# Patient Record
Sex: Male | Born: 1955 | Race: White | Hispanic: No | Marital: Married | Smoking: Never smoker
Health system: Southern US, Community
[De-identification: ages and names within clinical notes are randomized; demographics above are authoritative.]

## PROBLEM LIST (undated history)

## (undated) DIAGNOSIS — L570 Actinic keratosis: Secondary | ICD-10-CM

## (undated) DIAGNOSIS — F419 Anxiety disorder, unspecified: Secondary | ICD-10-CM

## (undated) DIAGNOSIS — M1990 Unspecified osteoarthritis, unspecified site: Secondary | ICD-10-CM

## (undated) DIAGNOSIS — K4090 Unilateral inguinal hernia, without obstruction or gangrene, not specified as recurrent: Secondary | ICD-10-CM

## (undated) DIAGNOSIS — M76891 Other specified enthesopathies of right lower limb, excluding foot: Secondary | ICD-10-CM

## (undated) DIAGNOSIS — I499 Cardiac arrhythmia, unspecified: Secondary | ICD-10-CM

## (undated) DIAGNOSIS — M722 Plantar fascial fibromatosis: Secondary | ICD-10-CM

## (undated) DIAGNOSIS — E7800 Pure hypercholesterolemia, unspecified: Secondary | ICD-10-CM

## (undated) HISTORY — PX: COLONOSCOPY: SHX174

## (undated) HISTORY — PX: TONSILLECTOMY: SUR1361

## (undated) HISTORY — PX: NASAL SEPTUM SURGERY: SHX37

---

## 2001-02-12 ENCOUNTER — Ambulatory Visit (HOSPITAL_COMMUNITY): Admission: RE | Admit: 2001-02-12 | Discharge: 2001-02-12 | Payer: Self-pay | Admitting: Gastroenterology

## 2003-11-17 ENCOUNTER — Other Ambulatory Visit: Payer: Self-pay

## 2013-09-20 ENCOUNTER — Other Ambulatory Visit: Payer: Self-pay | Admitting: *Deleted

## 2013-09-20 ENCOUNTER — Encounter: Payer: Self-pay | Admitting: Podiatry

## 2013-09-20 MED ORDER — MELOXICAM 15 MG PO TABS: 15.0000 mg | ORAL_TABLET | Freq: Every day | ORAL | Status: DC

## 2013-09-20 NOTE — Telephone Encounter (Signed)
FAX REQUEST FROM [facility] REQ MELOXICAM 15 MG #30 WITH 3 REFILLS APPROVED BY DR REGAL.

## 2014-04-14 ENCOUNTER — Other Ambulatory Visit: Payer: Self-pay | Admitting: *Deleted

## 2014-04-14 MED ORDER — MELOXICAM 15 MG PO TABS: 15.0000 mg | ORAL_TABLET | Freq: Every day | ORAL | Status: DC

## 2014-04-14 NOTE — Telephone Encounter (Signed)
REFILL FOR MOBIC OK

## 2014-12-20 ENCOUNTER — Telehealth: Payer: Self-pay | Admitting: Gastroenterology

## 2014-12-20 NOTE — Telephone Encounter (Signed)
Colonoscopy triage °

## 2015-01-11 ENCOUNTER — Telehealth: Payer: Self-pay | Admitting: Gastroenterology

## 2015-01-11 NOTE — Telephone Encounter (Signed)
Phoned patient for colon triage, no answer left message to contact office  °

## 2015-01-11 NOTE — Telephone Encounter (Signed)
Phoned patient for colon triage, no answer left VM to contact office

## 2015-06-06 ENCOUNTER — Encounter: Payer: Self-pay | Admitting: Podiatry

## 2015-06-06 ENCOUNTER — Ambulatory Visit (INDEPENDENT_AMBULATORY_CARE_PROVIDER_SITE_OTHER): Payer: BLUE CROSS/BLUE SHIELD

## 2015-06-06 ENCOUNTER — Ambulatory Visit (INDEPENDENT_AMBULATORY_CARE_PROVIDER_SITE_OTHER): Payer: BLUE CROSS/BLUE SHIELD | Admitting: Podiatry

## 2015-06-06 VITALS — BP 145/88 | HR 50 | Resp 16 | Ht 72.0 in | Wt 180.0 lb

## 2015-06-06 DIAGNOSIS — M722 Plantar fascial fibromatosis: Secondary | ICD-10-CM

## 2015-06-06 MED ORDER — TRIAMCINOLONE ACETONIDE 10 MG/ML IJ SUSP
10.0000 mg | Freq: Once | INTRAMUSCULAR | Status: AC
  Administered 2015-06-06: 10 mg

## 2015-06-06 NOTE — Progress Notes (Signed)
   Subjective:    Patient ID: Alex Burch, male    DOB: 04-13-1956, 60 y.o.   MRN: [id number]  HPI Patient presents with bilateral foot pain. On Left foot-heel; Right foot-arch. Pt stated, "arch feels tender"; x2 months.  Pt had EPF surgery on right foot 7-8 yrs ago.   Review of Systems  Respiratory: Positive for cough.   All other systems reviewed and are negative.      Objective:   Physical Exam        Assessment & Plan:

## 2015-06-06 NOTE — Patient Instructions (Signed)

## 2015-06-08 NOTE — Progress Notes (Signed)
Subjective:     Patient ID: Alex Burch, male   DOB: 11/11/1955, 60 y.o.   MRN: [id number]  HPI patient states my left heel has been bothering me some and while I'm very active I'm starting to notice pain over the last several months. The right arch is slightly sore but the heel that surgery was done on is doing very well   Review of Systems  All other systems reviewed and are negative.      Objective:   Physical Exam  Constitutional: He is oriented to person, place, and time.  Cardiovascular: Intact distal pulses.   Musculoskeletal: Normal range of motion.  Neurological: He is oriented to person, place, and time.  Skin: Skin is warm.  Nursing note and vitals reviewed.  neurovascular status found to be intact with muscle strength adequate range of motion within normal limits. Moderate depression of the arch is noted bilateral with a well-healed surgical scar on the right heel secondary to endoscopic surgery. The left heel is quite tender when pressed and there is fluid buildup     Assessment:      acute plantar fasciitis left with long-term history of condition and moderate structural deformity    Plan:      H&P and x-rays of both feet reviewed with patient. Today I injected the medial band left 3 mg Kenalog 5 mg Xylocaine and gave instructions on physical therapy and dispensed fascial brace. If symptoms persist may require new orthotics and we will reevaluate as needed

## 2015-12-05 ENCOUNTER — Ambulatory Visit (INDEPENDENT_AMBULATORY_CARE_PROVIDER_SITE_OTHER): Payer: BLUE CROSS/BLUE SHIELD | Admitting: Podiatry

## 2015-12-05 ENCOUNTER — Encounter: Payer: Self-pay | Admitting: Podiatry

## 2015-12-05 DIAGNOSIS — M722 Plantar fascial fibromatosis: Secondary | ICD-10-CM

## 2015-12-05 MED ORDER — TRIAMCINOLONE ACETONIDE 10 MG/ML IJ SUSP
10.0000 mg | Freq: Once | INTRAMUSCULAR | Status: AC
  Administered 2015-12-05: 10 mg

## 2015-12-05 NOTE — Progress Notes (Signed)
Subjective:     Patient ID: Alex Burch, male   DOB: June 20, 1955, 60 y.o.   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 Reappoint when ready

## 2016-02-01 ENCOUNTER — Ambulatory Visit (INDEPENDENT_AMBULATORY_CARE_PROVIDER_SITE_OTHER): Payer: BLUE CROSS/BLUE SHIELD | Admitting: Podiatry

## 2016-02-01 ENCOUNTER — Encounter: Payer: Self-pay | Admitting: Podiatry

## 2016-02-01 DIAGNOSIS — M722 Plantar fascial fibromatosis: Secondary | ICD-10-CM | POA: Diagnosis not present

## 2016-02-01 MED ORDER — TRIAMCINOLONE ACETONIDE 10 MG/ML IJ SUSP
10.0000 mg | Freq: Once | INTRAMUSCULAR | Status: AC
  Administered 2016-02-01: 10 mg

## 2016-02-03 NOTE — Progress Notes (Signed)
Subjective:     Patient ID: Alex Burch, male   DOB: 1955-10-07, 60 y.o.   MRN: [id number]  HPI patient states I'm doing well but I still get pain in this left arch   Review of Systems     Objective:   Physical Exam Neurovascular status intact muscle strength adequate discomfort in the mid arch area left with orthotics that appear to be healing and fitted well    Assessment:     Residual plantar fasciitis left arch    Plan:     Careful mid arch injection left administered 3 mg Kenalog 5 mg Xylocaine and advised on supportive shoes physical therapy and reappoint to recheck

## 2016-10-13 ENCOUNTER — Ambulatory Visit (INDEPENDENT_AMBULATORY_CARE_PROVIDER_SITE_OTHER): Payer: BLUE CROSS/BLUE SHIELD | Admitting: Podiatry

## 2016-10-13 DIAGNOSIS — T148XXA Other injury of unspecified body region, initial encounter: Secondary | ICD-10-CM

## 2016-10-13 DIAGNOSIS — W450XXA Nail entering through skin, initial encounter: Secondary | ICD-10-CM

## 2016-10-13 NOTE — Progress Notes (Signed)
This patient presents the office with chief complaint that his left big toe is developing fluid under the cuticle. He says this been present for the last 3-4 days. He says he is very active and athletic. He states that it has gotten worse, but it is not infected or inflamed or draining. He presents the office for an evaluation and possible treatment at this time  GENERAL APPEARANCE: Alert, conversant. Appropriately groomed. No acute distress.  VASCULAR: Pedal pulses are  palpable at  [facility] and PT bilateral.  Capillary refill time is immediate to all digits,  Normal temperature gradient.  Digital hair growth is present bilateral  NEUROLOGIC: sensation is normal to 5.07 monofilament at 5/5 sites bilateral.  Light touch is intact bilateral, Muscle strength normal.  MUSCULOSKELETAL: acceptable muscle strength, tone and stability bilateral.  Intrinsic muscluature intact bilateral.  Rectus appearance of foot and digits noted bilateral.   DERMATOLOGIC: skin color, texture, and turgor are within normal limits.  No preulcerative lesions or ulcers  are seen, no interdigital maceration noted.  No open lesions present.  . No drainage noted.    Medial border right great toe is surgically removed.  The proximal nail fold medial left is elevated under the proximal nail fold.  No redness or swelling noted.  Hematoma secondary to movement to the medial left proximal nail fold.  Treatment.  ROV.  Discussed this condition with this patient and explained that the nail has become unattached at the proximal nail fold which has led to redness and inflammation at this point, I chose to allow the toe to declare itself. I explained to the patient. It may improve on its own or it worsen, at which time we need to consider surgical correction of that nail border. Return to clinic when necessary   Gardiner Barefoot DPM

## 2016-11-11 ENCOUNTER — Ambulatory Visit
Admission: RE | Admit: 2016-11-11 | Discharge: 2016-11-11 | Disposition: A | Discharge status: Home / Self Care | Payer: BLUE CROSS/BLUE SHIELD | Source: Ambulatory Visit | Attending: Internal Medicine | Admitting: Internal Medicine

## 2016-11-11 ENCOUNTER — Other Ambulatory Visit: Payer: Self-pay | Admitting: Internal Medicine

## 2016-11-11 DIAGNOSIS — R918 Other nonspecific abnormal finding of lung field: Secondary | ICD-10-CM | POA: Diagnosis not present

## 2016-11-11 DIAGNOSIS — R059 Cough, unspecified: Secondary | ICD-10-CM

## 2016-11-11 DIAGNOSIS — R05 Cough: Secondary | ICD-10-CM

## 2017-12-15 IMAGING — CR DG CHEST 2V
1 series · 3 of 3 positions shown · non-contrast
Comparison: None in PACs

CLINICAL DATA: Two weeks of cough and wheezing and fatigue. Not
improving.

EXAM:
CHEST  2 VIEW

[Series 1: dg chest 2 view · 0.14mm/px · 3 of 3 slices shown]
[im 1/3]
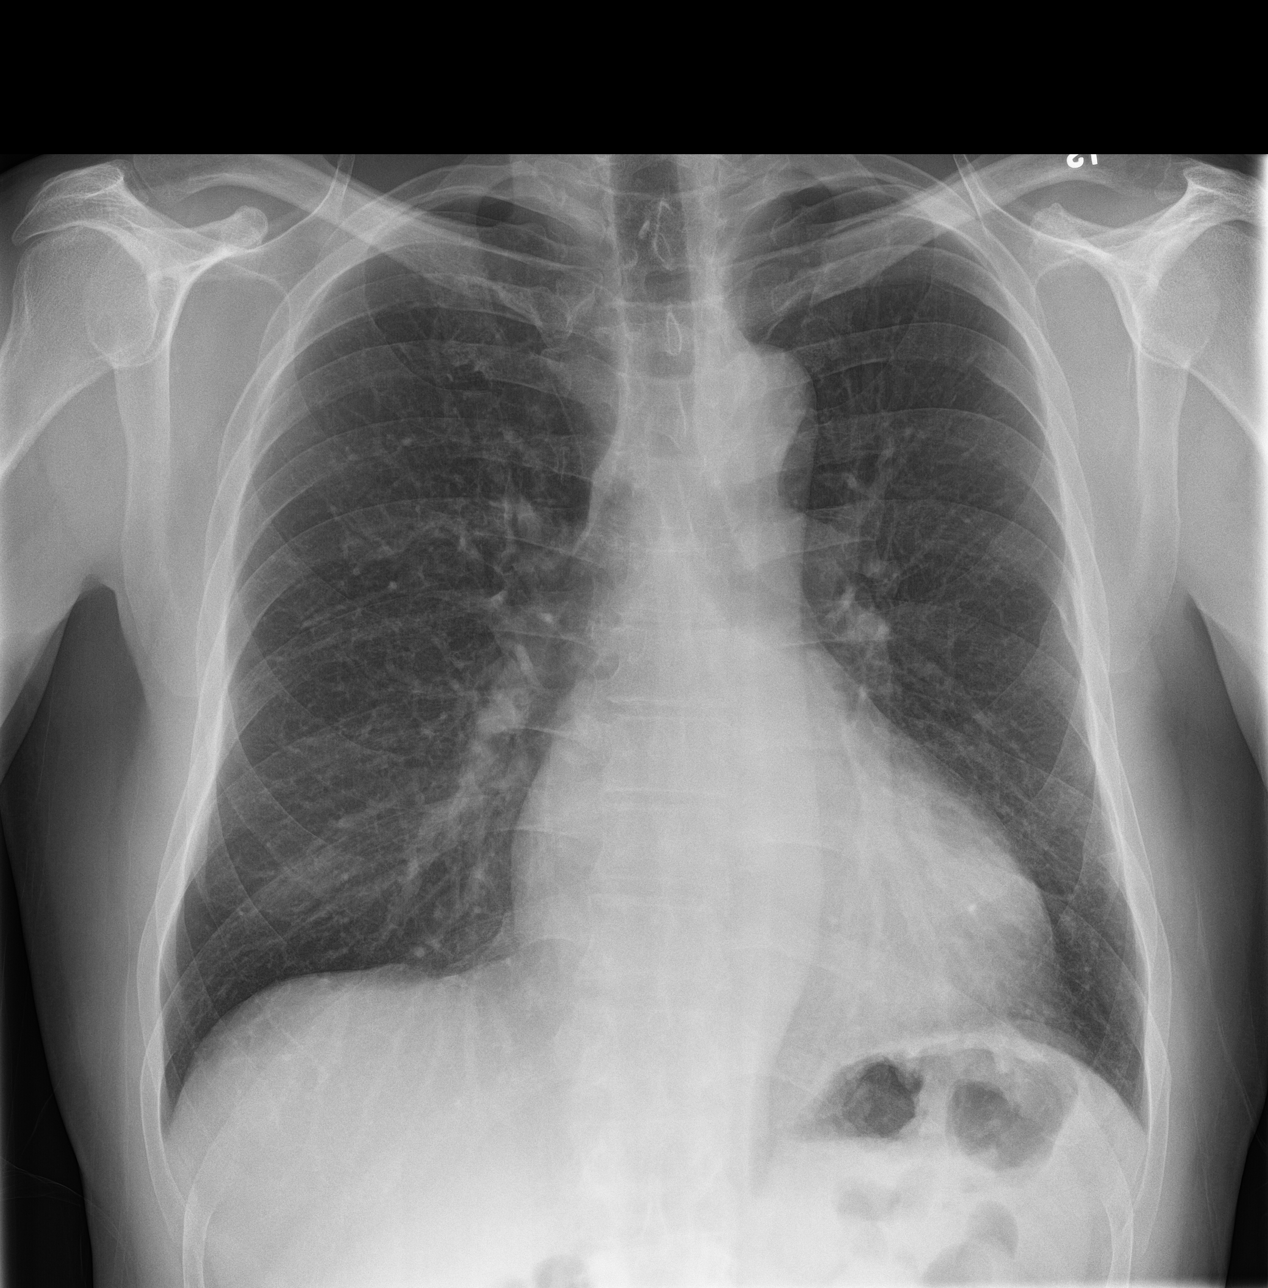
[im 2/3]
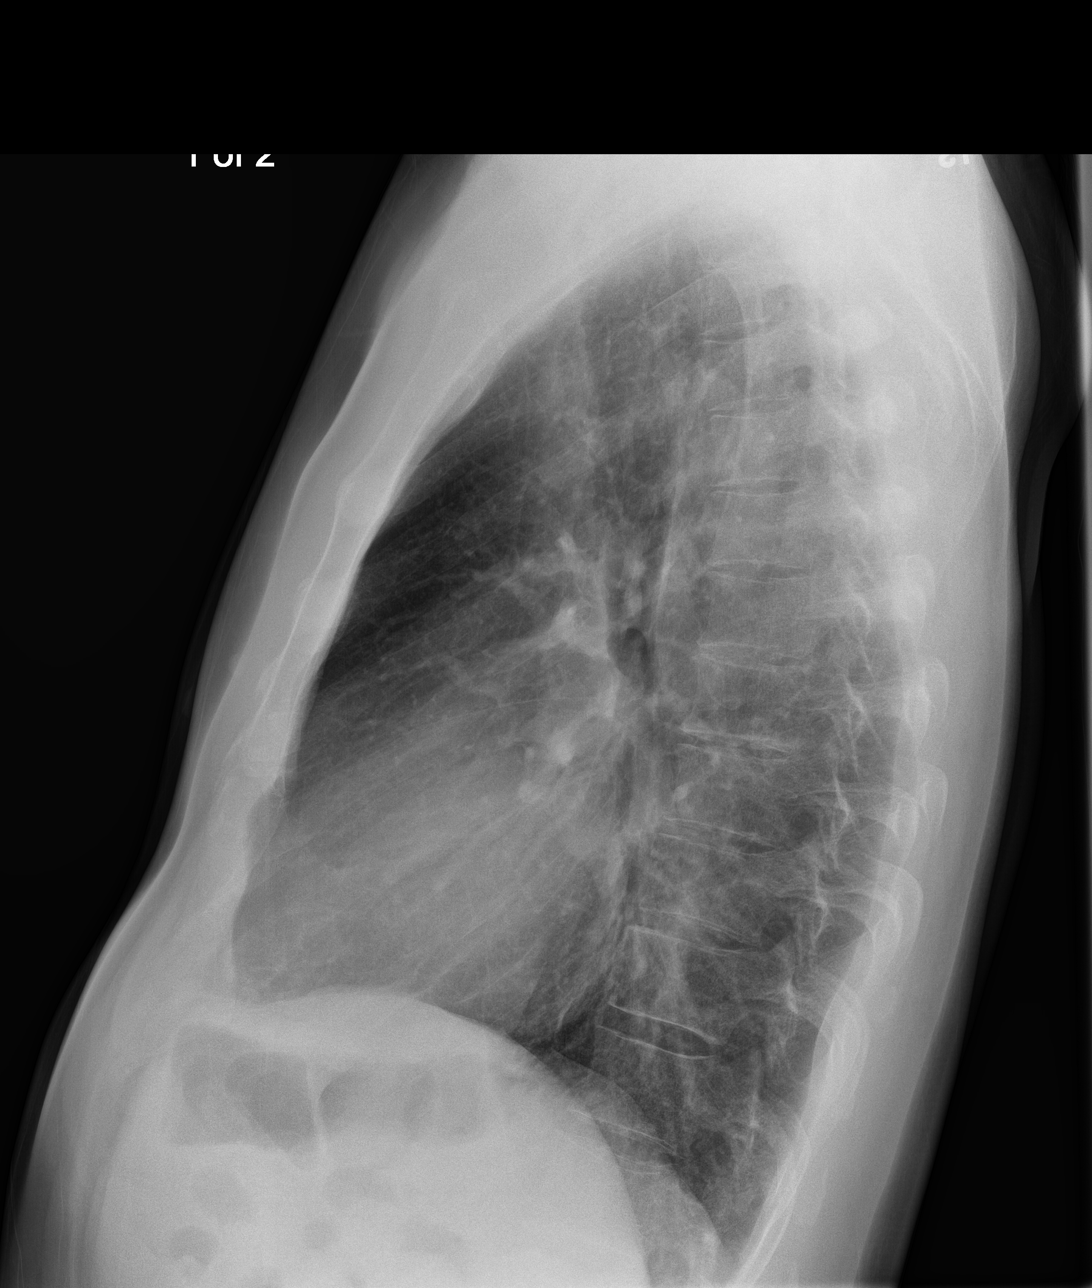
[im 3/3]
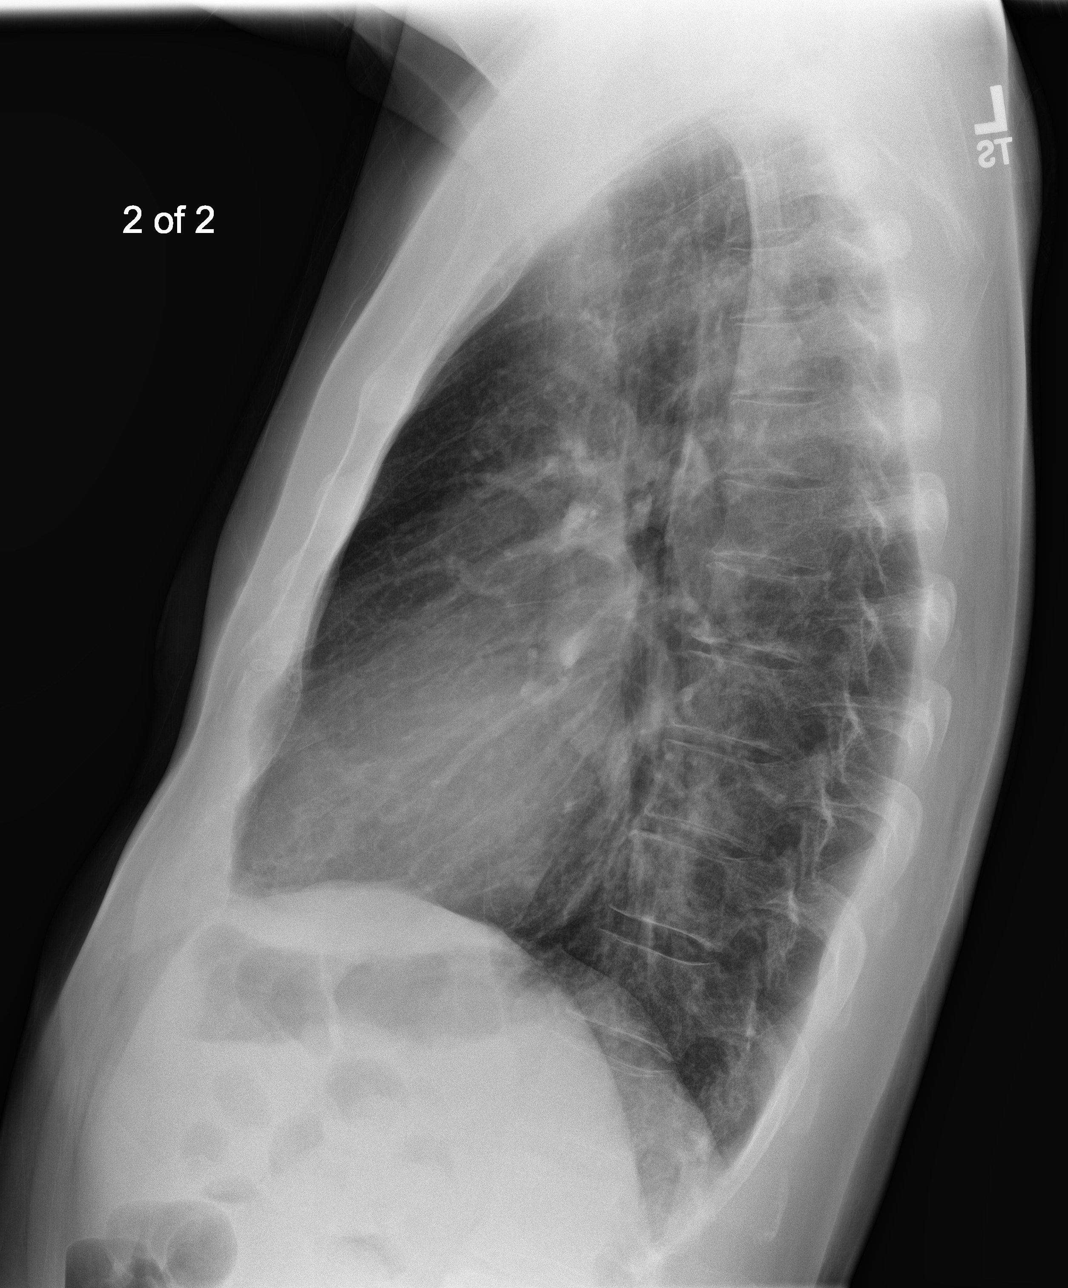

[3 of 3 positions shown; findings below may reference images not displayed]

FINDINGS: The lungs are mildly hyperinflated without hemidiaphragm flattening.
There is no focal infiltrate. The heart and pulmonary vascularity
are normal. The mediastinum is normal in width. There is no pleural
effusion. The bony thorax exhibits no acute abnormality.
IMPRESSION: Mild hyperinflation may be voluntary or may reflect air trapping
secondary to acute bronchitis. There is no pneumonia, CHF, nor other
acute cardiopulmonary abnormality.

If the patient's symptoms persist despite appropriate antibiotic
therapy, chest CT scanning would be a useful next imaging step.

## 2019-01-13 ENCOUNTER — Ambulatory Visit (INDEPENDENT_AMBULATORY_CARE_PROVIDER_SITE_OTHER): Payer: BC Managed Care – PPO | Admitting: Podiatry

## 2019-01-13 ENCOUNTER — Encounter: Payer: Self-pay | Admitting: Podiatry

## 2019-01-13 ENCOUNTER — Other Ambulatory Visit: Payer: Self-pay

## 2019-01-13 ENCOUNTER — Ambulatory Visit (INDEPENDENT_AMBULATORY_CARE_PROVIDER_SITE_OTHER): Payer: BC Managed Care – PPO

## 2019-01-13 ENCOUNTER — Other Ambulatory Visit: Payer: Self-pay | Admitting: Podiatry

## 2019-01-13 VITALS — Temp 97.9°F

## 2019-01-13 DIAGNOSIS — M722 Plantar fascial fibromatosis: Secondary | ICD-10-CM

## 2019-01-13 DIAGNOSIS — M79672 Pain in left foot: Secondary | ICD-10-CM

## 2019-01-13 DIAGNOSIS — M79671 Pain in right foot: Secondary | ICD-10-CM

## 2019-01-13 NOTE — Patient Instructions (Signed)

## 2019-01-14 NOTE — Progress Notes (Signed)
Subjective:   Patient ID: Alex Burch, male   DOB: 63 y.o.   MRN: [id number]   HPI Patient states he is getting pain in the arch of both feet and that it just started in the last few months.  He is very active and likes to walk 5 to 8 miles a day   ROS      Objective:  Physical Exam  Neurovascular status intact with inflammation of the distal fascial bilateral with fluid buildup and pain with palpation.  Patient has moderate depression of the arch also noted     Assessment:  Distal fasciitis bilateral with inflammation pain along with moderate depression of the arch     Plan:  H&P reviewed condition and did careful distal injection 3 mg Kenalog 5 mg Xylocaine and advised this patient on orthotics and scheduled with Liliane Channel and he will bring his old orthotics have a new pair made and possibly have these rehabilitated  X-rays indicate moderate depression of the arch with no other sign of significant arthritic pathology

## 2019-01-27 ENCOUNTER — Ambulatory Visit (INDEPENDENT_AMBULATORY_CARE_PROVIDER_SITE_OTHER): Payer: BC Managed Care – PPO | Admitting: Podiatry

## 2019-01-27 ENCOUNTER — Other Ambulatory Visit: Payer: Self-pay

## 2019-01-27 ENCOUNTER — Ambulatory Visit: Payer: BC Managed Care – PPO | Admitting: Orthotics

## 2019-01-27 DIAGNOSIS — M79672 Pain in left foot: Secondary | ICD-10-CM

## 2019-01-27 DIAGNOSIS — M722 Plantar fascial fibromatosis: Secondary | ICD-10-CM

## 2019-01-27 DIAGNOSIS — M79671 Pain in right foot: Secondary | ICD-10-CM

## 2019-01-27 NOTE — Progress Notes (Signed)

## 2019-01-28 NOTE — Progress Notes (Signed)
Subjective:   Patient ID: Alex Burch, male   DOB: 63 y.o.   MRN: [id number]   HPI Patient states doing a lot better since last time he was here with patient here for new orthotics   ROS      Objective:  Physical Exam  Neurovascular status intact with patient noted to have significant diminishment of distal fascial inflammation bilateral     Assessment:  Improvement in distal fascial bilateral with injection treatment with patient bringing shoes and to review today and here for orthotic casting     Plan:  Reviewed orthotics and measured and went ahead and casted for new functional type devices.  I also discussed his distal fasciitis and I discussed his shoe gear and several of them are to flexible and I recommended a rigid bottom type shoe for usage

## 2019-03-02 ENCOUNTER — Other Ambulatory Visit: Payer: BC Managed Care – PPO | Admitting: Orthotics

## 2019-08-30 ENCOUNTER — Ambulatory Visit (INDEPENDENT_AMBULATORY_CARE_PROVIDER_SITE_OTHER): Payer: Self-pay

## 2019-08-30 ENCOUNTER — Other Ambulatory Visit: Payer: Self-pay

## 2019-08-30 DIAGNOSIS — Z872 Personal history of diseases of the skin and subcutaneous tissue: Secondary | ICD-10-CM

## 2019-08-30 DIAGNOSIS — L570 Actinic keratosis: Secondary | ICD-10-CM

## 2019-08-30 DIAGNOSIS — L578 Other skin changes due to chronic exposure to nonionizing radiation: Secondary | ICD-10-CM

## 2019-10-03 ENCOUNTER — Ambulatory Visit (INDEPENDENT_AMBULATORY_CARE_PROVIDER_SITE_OTHER): Payer: BC Managed Care – PPO | Admitting: Dermatology

## 2019-10-03 ENCOUNTER — Other Ambulatory Visit: Payer: Self-pay

## 2019-10-03 DIAGNOSIS — L578 Other skin changes due to chronic exposure to nonionizing radiation: Secondary | ICD-10-CM

## 2019-10-03 DIAGNOSIS — D1801 Hemangioma of skin and subcutaneous tissue: Secondary | ICD-10-CM

## 2019-10-03 DIAGNOSIS — D229 Melanocytic nevi, unspecified: Secondary | ICD-10-CM

## 2019-10-03 DIAGNOSIS — Z1283 Encounter for screening for malignant neoplasm of skin: Secondary | ICD-10-CM

## 2019-10-03 DIAGNOSIS — K4090 Unilateral inguinal hernia, without obstruction or gangrene, not specified as recurrent: Secondary | ICD-10-CM | POA: Diagnosis not present

## 2019-10-03 DIAGNOSIS — L821 Other seborrheic keratosis: Secondary | ICD-10-CM

## 2019-10-03 DIAGNOSIS — Z872 Personal history of diseases of the skin and subcutaneous tissue: Secondary | ICD-10-CM

## 2019-10-03 DIAGNOSIS — L570 Actinic keratosis: Secondary | ICD-10-CM | POA: Diagnosis not present

## 2019-10-03 DIAGNOSIS — L820 Inflamed seborrheic keratosis: Secondary | ICD-10-CM

## 2019-10-03 NOTE — Progress Notes (Signed)
     Follow-Up Visit   Subjective  Alex Burch is a 64 y.o. male who presents for the following: Annual Exam (TBSE, hx of Aks ). Check spot on my left side of face changing color, growing for several weeks now.  Patient presents for total-body skin exam for skin cancer screening and mole check. There is family history of melanoma in his sister.  The following portions of the chart were reviewed this encounter and updated as appropriate:  Tobacco  Allergies  Meds  Problems  Med Hx  Surg Hx  Fam Hx     Review of Systems:  No other skin or systemic complaints except as noted in HPI or Assessment and Plan.  Objective  Well appearing patient in no apparent distress; mood and affect are within normal limits.  A full examination was performed including scalp, head, eyes, ears, nose, lips, neck, chest, axillae, abdomen, back, buttocks, bilateral upper extremities, bilateral lower extremities, hands, feet, fingers, toes, fingernails, and toenails. All findings within normal limits unless otherwise noted below.  Objective  L side burn: Erythematous thin papules/macules with gritty scale.   Objective  Left chest: Erythematous keratotic or waxy stuck-on papule or plaque.   Objective  Right Inguinal Area: Hernia    Assessment & Plan  AK (actinic keratosis) L side burn  Destruction of lesion - L side burn Complexity: simple   Destruction method: cryotherapy   Informed consent: discussed and consent obtained   Timeout:  patient name, date of birth, surgical site, and procedure verified Lesion destroyed using liquid nitrogen: Yes   Region frozen until ice ball extended beyond lesion: Yes   Outcome: patient tolerated procedure well with no complications   Post-procedure details: wound care instructions given    Inflamed seborrheic keratosis Left chest  Destruction of lesion - Left chest Complexity: simple   Destruction method: cryotherapy   Informed consent:  discussed and consent obtained   Timeout:  patient name, date of birth, surgical site, and procedure verified Lesion destroyed using liquid nitrogen: Yes   Region frozen until ice ball extended beyond lesion: Yes   Outcome: patient tolerated procedure well with no complications   Post-procedure details: wound care instructions given     Inguinal hernia without obstruction or gangrene, recurrence not specified, unspecified laterality Right Inguinal Area Recommend consult with PCP    Skin cancer screening performed today.  Actinic Damage - diffuse scaly erythematous macules with underlying dyspigmentation - Recommend daily broad spectrum sunscreen SPF 30+ to sun-exposed areas, reapply every 2 hours as needed.  - Call for new or changing lesions.  Melanocytic Nevi - Tan-brown and/or pink-flesh-colored symmetric macules and papules - Benign appearing on exam today - Observation - Call clinic for new or changing moles - Recommend daily use of broad spectrum spf 30+ sunscreen to sun-exposed areas.   Seborrheic Keratoses - Stuck-on, waxy, tan-brown papules and plaques  - Discussed benign etiology and prognosis. - Observe - Call for any changes  Hemangiomas - Red papules - Discussed benign nature - Observe - Call for any changes   Return in about 6 months (around 04/04/2020) for Aks, face .  IMarye Round, CMA, am acting as scribe for Sarina Ser, MD .  Documentation: I have reviewed the above documentation for accuracy and completeness, and I agree with the above.  Sarina Ser, MD

## 2019-10-03 NOTE — Patient Instructions (Signed)

## 2019-10-04 ENCOUNTER — Encounter: Payer: Self-pay | Admitting: Dermatology

## 2020-02-06 ENCOUNTER — Other Ambulatory Visit: Payer: Self-pay

## 2020-02-06 ENCOUNTER — Ambulatory Visit (INDEPENDENT_AMBULATORY_CARE_PROVIDER_SITE_OTHER): Payer: Self-pay

## 2020-02-06 DIAGNOSIS — L578 Other skin changes due to chronic exposure to nonionizing radiation: Secondary | ICD-10-CM

## 2020-02-06 DIAGNOSIS — L570 Actinic keratosis: Secondary | ICD-10-CM

## 2020-02-06 DIAGNOSIS — Z872 Personal history of diseases of the skin and subcutaneous tissue: Secondary | ICD-10-CM

## 2020-02-06 NOTE — Progress Notes (Signed)
Pt presents today for his 2nd Halo to the face. He has no history of Fever blisters. Treating sun-damaged skin and AK's. Post care instructions and kit given to pt. jj

## 2020-04-04 ENCOUNTER — Other Ambulatory Visit: Payer: Self-pay

## 2020-04-04 ENCOUNTER — Ambulatory Visit (INDEPENDENT_AMBULATORY_CARE_PROVIDER_SITE_OTHER): Payer: BC Managed Care – PPO | Admitting: Dermatology

## 2020-04-04 DIAGNOSIS — L814 Other melanin hyperpigmentation: Secondary | ICD-10-CM | POA: Diagnosis not present

## 2020-04-04 DIAGNOSIS — L820 Inflamed seborrheic keratosis: Secondary | ICD-10-CM | POA: Diagnosis not present

## 2020-04-04 DIAGNOSIS — Z1283 Encounter for screening for malignant neoplasm of skin: Secondary | ICD-10-CM | POA: Diagnosis not present

## 2020-04-04 DIAGNOSIS — D692 Other nonthrombocytopenic purpura: Secondary | ICD-10-CM

## 2020-04-04 DIAGNOSIS — L570 Actinic keratosis: Secondary | ICD-10-CM

## 2020-04-04 DIAGNOSIS — L578 Other skin changes due to chronic exposure to nonionizing radiation: Secondary | ICD-10-CM

## 2020-04-04 DIAGNOSIS — L821 Other seborrheic keratosis: Secondary | ICD-10-CM

## 2020-04-04 NOTE — Progress Notes (Signed)
   Follow-Up Visit   Subjective  Alex Burch is a 63 y.o. male who presents for the following: Actinic Keratosis (face, 30m f/u). He also has changing spots on face that irritate and he wants treated. The patient presents for Upper Body Skin Exam (UBSE) for skin cancer screening and mole check.  The following portions of the chart were reviewed this encounter and updated as appropriate:  Tobacco  Allergies  Meds  Problems  Med Hx  Surg Hx  Fam Hx     Review of Systems:  No other skin or systemic complaints except as noted in HPI or Assessment and Plan.  Objective  Well appearing patient in no apparent distress; mood and affect are within normal limits.  All skin waist up examined.  Objective  Right Ear x 1: Pink scaly macules   Objective  face x 10 (10): Erythematous keratotic or waxy stuck-on papule or plaque.    Assessment & Plan    Actinic Damage - chronic, secondary to cumulative UV radiation exposure/sun exposure over time - diffuse scaly erythematous macules with underlying dyspigmentation - Recommend daily broad spectrum sunscreen SPF 30+ to sun-exposed areas, reapply every 2 hours as needed.  - Call for new or changing lesions.  Purpura - Violaceous macules and patches - Benign - Related to age, sun damage and/or use of blood thinners - Observe - Can use OTC arnica containing moisturizer such as Dermend Bruise Formula if desired - Call for worsening or other concerns  Lentigines - Scattered tan macules - Discussed due to sun exposure - Benign, observe - Call for any changes  Skin cancer screening performed today.  Seborrheic Keratoses - Stuck-on, waxy, tan-brown papules and plaques  - Discussed benign etiology and prognosis. - Observe - Call for any changes  AK (actinic keratosis) Right Ear x 1  Destruction of lesion - Right Ear x 1 Complexity: simple   Destruction method: cryotherapy   Informed consent: discussed and consent  obtained   Timeout:  patient name, date of birth, surgical site, and procedure verified Lesion destroyed using liquid nitrogen: Yes   Region frozen until ice ball extended beyond lesion: Yes   Outcome: patient tolerated procedure well with no complications   Post-procedure details: wound care instructions given    Inflamed seborrheic keratosis (10) face x 10  Destruction of lesion - face x 10 Complexity: simple   Destruction method: cryotherapy   Informed consent: discussed and consent obtained   Timeout:  patient name, date of birth, surgical site, and procedure verified Lesion destroyed using liquid nitrogen: Yes   Region frozen until ice ball extended beyond lesion: Yes   Outcome: patient tolerated procedure well with no complications   Post-procedure details: wound care instructions given    Return in about 1 year (around 04/04/2021) for TBSE, hx of AKs.   I, Othelia Pulling, RMA, am acting as scribe for Sarina Ser, MD .  Documentation: I have reviewed the above documentation for accuracy and completeness, and I agree with the above.  Sarina Ser, MD

## 2020-04-05 ENCOUNTER — Encounter: Payer: Self-pay | Admitting: Dermatology

## 2020-04-30 ENCOUNTER — Inpatient Hospital Stay: Admission: RE | Admit: 2020-04-30 | Payer: BC Managed Care – PPO | Source: Ambulatory Visit

## 2020-05-02 NOTE — H&P (Signed)
NAME: TERREN, JANDREAU MEDICAL RECORD [id number] ACCOUNT [id number] DATE OF BIRTH:July 02, 1955 FACILITY: [facility] LOCATION:  PHYSICIAN:Analleli Gierke Farrel Conners, MD  HISTORY AND PHYSICAL  DATE OF ADMISSION:  05/10/2020  CHIEF COMPLAINT:  Right groin pain.  HISTORY OF PRESENT ILLNESS:  The patient is a 64 year old white male who has had a right inguinal hernia for several years, but now the hernia is causing discomfort and a significant visible bulge.  He comes in now for right inguinal herniorrhaphy.  PAST MEDICAL HISTORY: ALLERGIES:  THE PATIENT WAS ALLERGIC TO PENICILLIN.  CURRENT MEDICATIONS:  Meloxicam, Prozac, Crestor and Xanax.  PAST SURGICAL HISTORY:  Nasal reconstructive surgery 2019.  PAST AND CURRENT MEDICAL CONDITIONS: 1.  Hypercholesterolemia. 2.  Degenerative joint disease. 3.  Anxiety.  REVIEW OF SYSTEMS:  The patient denies chest pain, heart disease, stroke or diabetes.  SOCIAL HISTORY:  The patient denied tobacco use.  He consumes 10 alcoholic beverages per week.  FAMILY HISTORY:  Father died at age 79 with COPD.  Mother is living, age 18 without any significant health problems.  There is no family history of urological disease or urological cancer.  PHYSICAL EXAMINATION: VITAL SIGNS:  Height 6 feet 0 inches, weight 188 pounds. GENERAL:  Well-nourished white male in no acute distress. HEENT:  Sclerae were clear.  Pupils are equally round, reactive to light and accommodation.  Extraocular movements are intact. NECK:  No palpable thyroid nodules.  No audible carotid bruits. LYMPHATIC:  No palpable inguinal or cervical adenopathy. PULMONARY:  Lungs clear to auscultation. CARDIOVASCULAR:  Regular rhythm and rate without audible murmurs. ABDOMEN:  Soft, nontender abdomen. GENITOURINARY:  Circumcised.  Testes smooth, nontender, 20 mL in size each.  There was an easily reducible right inguinal hernia present. RECTAL:  Deferred. NEUROMUSCULAR:  Alert and oriented  x3.  IMPRESSION:  Symptomatic right inguinal hernia.  PLAN:  Right inguinal herniorrhaphy.  HN/NUANCE  D:05/01/2020 T:05/01/2020 JOB:[id number]

## 2020-05-04 ENCOUNTER — Other Ambulatory Visit
Admission: RE | Admit: 2020-05-04 | Discharge: 2020-05-04 | Disposition: A | Discharge status: Home / Self Care | Payer: BC Managed Care – PPO | Source: Ambulatory Visit | Attending: Urology | Admitting: Urology

## 2020-05-04 HISTORY — DX: Unspecified osteoarthritis, unspecified site: M19.90

## 2020-05-04 HISTORY — DX: Other specified enthesopathies of right lower limb, excluding foot: M76.891

## 2020-05-04 HISTORY — DX: Unilateral inguinal hernia, without obstruction or gangrene, not specified as recurrent: K40.90

## 2020-05-04 HISTORY — DX: Actinic keratosis: L57.0

## 2020-05-04 HISTORY — DX: Pure hypercholesterolemia, unspecified: E78.00

## 2020-05-04 HISTORY — DX: Plantar fascial fibromatosis: M72.2

## 2020-05-04 HISTORY — DX: Anxiety disorder, unspecified: F41.9

## 2020-05-04 NOTE — Pre-Procedure Instructions (Signed)
Mr. Alex Burch voiced to this writer that after he had a surgical procedure when he was approximately 64 years of age, he suffered from urinary retention and required catherization, He wanted this information to be known as he is concerned that it may happen again. He has a hernia repair surgery schedule 05/10/20 with Dr. Eliberto Ivory. This Probation officer notified Angie at [place].

## 2020-05-04 NOTE — Progress Notes (Signed)
[facility] Perioperative Services: Pre-Admission/Anesthesia Testing   Date: 05/04/20 Name: Alex Burch MRN:   [id number]  Re: Consideration of preoperative prophylactic antibiotic change   Request sent to: Royston Cowper, MD (routed and/or faxed via [facility])  Planned Surgical Procedure(s):    Case: [id number] Date/Time: 05/10/20 0715   Procedure: HERNIA REPAIR INGUINAL ADULT (Right )   Anesthesia type: Choice   Pre-op diagnosis: RIGHT INGUINAL HERNIA   Location: [facility] OR ROOM 10 / [place] [facility] FOR ANESTHESIA GROUP   Surgeons: Royston Cowper, MD    Notes: 1. Patient has a documented allergy to PCN  . Advising that PCN has caused him to experience urticarial rash in the past.   2. Screened as appropriate for cephalosporin use during medication reconciliation . No immediate angioedema, dysphagia, SOB, anaphylaxis symptoms. . No severe rash involving mucous membranes or skin necrosis. . No hospital admissions related to side effects of PCN/cephalosporin use.  . No documented reaction to PCN or cephalosporin in the last 10 years.  Request:  As an evidence based approach to reducing the rate of incidence for post-operative SSI and the development of MDROs, could an agent with narrower coverage for preoperative prophylaxis in this patient's upcoming surgical course be considered?   1. Currently ordered preoperative prophylactic ABX: Levaquin.   2. Specifically requesting change to cephalosporin (CEFAZOLIN).   3. Please communicate decision with me and I will change the orders in Epic as per your direction.   Things to consider:  Many patients report that they were "allergic" to PCN earlier in life, however this does not translate into a true lifelong allergy. Patients can lose sensitivity to specific IgE antibodies over time if PCN is avoided (Kleris & Lugar, 2019).   Up to 10% of the adult population and 15% of hospitalized patients report an allergy to  PCN, however clinical studies suggest that 90% of those reporting an allergy can tolerate PCN antibiotics (Kleris & Lugar, 2019).   Cross-sensitivity between PCN and cephalosporins has been documented as being as high as 10%, however this estimation included data believed to have been collected in a setting where there was contamination. Newer data suggests that the prevalence of cross-sensitivity between PCN and cephalosporins is actually estimated to be closer to 1% (Hermanides et al., 2018).    Patients labeled as PCN allergic, whether they are truly allergic or not, have been found to have inferior outcomes in terms of rates of serious infection, and these patients tend to have longer hospital stays (Alfalfa, 2019).   Treatment related secondary infections, such as Clostridioides difficile, have been linked to the improper use of broad spectrum antibiotics in patients improperly labeled as PCN allergic (Kleris & Lugar, 2019).   Anaphylaxis from cephalosporins is rare and the evidence suggests that there is no increased risk of an anaphylactic type reaction when cephalosporins are used in a PCN allergic patient (Pichichero, 2006).  Citations: Hermanides J, Lemkes BA, Prins Pearla Dubonnet MW, Terreehorst I. Presumed ?-Lactam Allergy and Cross-reactivity in the Operating Theater: A Practical Approach. Anesthesiology. 2018 Aug;129(2):335-342. doi: 10.1097/ALN.0000000000002252. PMID: 51761607.  Kleris, [place]., & Lugar, P. L. (2019). Things We Do For No Reason: Failing to Question a Penicillin Allergy History. Journal of hospital medicine, 14(10), [phone]. Advance online publication. https://www.wallace-middleton.info/  Pichichero, M. E. (2006). Cephalosporins can be prescribed safely for penicillin-allergic patients. Journal of family medicine, 55(2), 106-112. Accessed: https://cdn.mdedge.com/files/s75fs-public/Document/September-2017/5502JFP_AppliedEvidence1.pdf   Honor Loh, MSN, APRN, FNP-C,  [facility] Nurse Practitioner FAX: [phone] 05/04/20 12:25 PM

## 2020-05-04 NOTE — Patient Instructions (Addendum)
Your procedure is scheduled on: 05/10/20 Report to the Registration Desk on the 1st floor of the [facility]. To find out your arrival time, please call [phone] between 1PM - 3PM on: 05/09/20  REMEMBER: Instructions that are not followed completely may result in serious medical risk, up to and including death; or upon the discretion of your surgeon and anesthesiologist your surgery may need to be rescheduled.  Do not eat food after midnight the night before surgery.  No gum chewing, lozengers or hard candies.  You may however, drink CLEAR liquids up to 2 hours before you are scheduled to arrive for your surgery. Do not drink anything within 2 hours of your scheduled arrival time.  Clear liquids include: - water  - apple juice without pulp - gatorade (not RED, PURPLE, OR BLUE) - black coffee or tea (Do NOT add milk or creamers to the coffee or tea) Do NOT drink anything that is not on this list.   TAKE THESE MEDICATIONS THE MORNING OF SURGERY WITH A SIP OF WATER: - ALPRAZolam (XANAX) 0.25 MG tablet if needed - FLUoxetine (PROZAC) 20 MG tablet - rosuvastatin (CRESTOR) 10 MG tablet   One week prior to surgery : MOBIC Stop Anti-inflammatories (NSAIDS) such as Advil, Aleve, Ibuprofen, Motrin, Naproxen, Naprosyn and Aspirin based products such as Excedrin, Goodys Powder, BC Powder.  Stop ANY OVER THE COUNTER supplements until after surgery. (However, you may continue taking Vitamin D, Vitamin B, and multivitamin up until the day before surgery.)  No Alcohol for 24 hours before or after surgery.  No Smoking including e-cigarettes for 24 hours prior to surgery.  No chewable tobacco products for at least 6 hours prior to surgery.  No nicotine patches on the day of surgery.  Do not use any "recreational" drugs for at least a week prior to your surgery.  Please be advised that the combination of cocaine and anesthesia may have negative outcomes, up to and including death. If you  test positive for cocaine, your surgery will be cancelled.  On the morning of surgery brush your teeth with toothpaste and water, you may rinse your mouth with mouthwash if you wish. Do not swallow any toothpaste or mouthwash.  Do not wear jewelry, make-up, hairpins, clips or nail polish.  Do not wear lotions, powders, or perfumes.   Do not shave body from the neck down 48 hours prior to surgery just in case you cut yourself which could leave a site for infection.  Also, freshly shaved skin may become irritated if using the CHG soap.  Contact lenses, hearing aids and dentures may not be worn into surgery.  Do not bring valuables to the hospital. [facility] is not responsible for any missing/lost belongings or valuables.   Notify your doctor if there is any change in your medical condition (cold, fever, infection).  Wear comfortable clothing (specific to your surgery type) to the hospital.  Plan for stool softeners for home use; pain medications have a tendency to cause constipation. You can also help prevent constipation by eating foods high in fiber such as fruits and vegetables and drinking plenty of fluids as your diet allows.  After surgery, you can help prevent lung complications by doing breathing exercises.  Take deep breaths and cough every 1-2 hours. Your doctor may order a device called an Incentive Spirometer to help you take deep breaths. When coughing or sneezing, hold a pillow firmly against your incision with both hands. This is called "splinting." Doing this  helps protect your incision. It also decreases belly discomfort.  If you are being admitted to the hospital overnight, leave your suitcase in the car. After surgery it may be brought to your room.  If you are being discharged the day of surgery, you will not be allowed to drive home. You will need a responsible adult (18 years or older) to drive you home and stay with you that night.   If you are taking public  transportation, you will need to have a responsible adult (18 years or older) with you. Please confirm with your physician that it is acceptable to use public transportation.   Please call the [facility] at [phone] if you have any questions about these instructions.  Visitation Policy:  Patients undergoing a surgery or procedure may have one family member or support person with them as long as that person is not COVID-19 positive or experiencing its symptoms.  That person may remain in the waiting area during the procedure.  Inpatient Visitation Update:   In an effort to ensure the safety of our team members and our patients, we are implementing a change to our visitation policy:  Effective Monday, Aug. 9, at 7 a.m., inpatients will be allowed one support person.  o The support person may change daily.  o The support person must pass our screening, gel in and out, and wear a mask at all times, including in the patient's room.  o Patients must also wear a mask when staff or their support person are in the room.  o Masking is required regardless of vaccination status.  Systemwide, no visitors 17 or younger.

## 2020-05-08 ENCOUNTER — Other Ambulatory Visit: Payer: Self-pay

## 2020-05-08 ENCOUNTER — Other Ambulatory Visit
Admission: RE | Admit: 2020-05-08 | Discharge: 2020-05-08 | Disposition: A | Discharge status: Home / Self Care | Payer: BC Managed Care – PPO | Source: Ambulatory Visit | Attending: Urology | Admitting: Urology

## 2020-05-08 DIAGNOSIS — Z791 Long term (current) use of non-steroidal anti-inflammatories (NSAID): Secondary | ICD-10-CM | POA: Diagnosis not present

## 2020-05-08 DIAGNOSIS — Z20822 Contact with and (suspected) exposure to covid-19: Secondary | ICD-10-CM | POA: Diagnosis not present

## 2020-05-08 DIAGNOSIS — E7800 Pure hypercholesterolemia, unspecified: Secondary | ICD-10-CM | POA: Diagnosis not present

## 2020-05-08 DIAGNOSIS — Z01812 Encounter for preprocedural laboratory examination: Secondary | ICD-10-CM | POA: Insufficient documentation

## 2020-05-08 DIAGNOSIS — Z79899 Other long term (current) drug therapy: Secondary | ICD-10-CM | POA: Diagnosis not present

## 2020-05-08 DIAGNOSIS — Z825 Family history of asthma and other chronic lower respiratory diseases: Secondary | ICD-10-CM | POA: Diagnosis not present

## 2020-05-08 DIAGNOSIS — Z880 Allergy status to penicillin: Secondary | ICD-10-CM | POA: Diagnosis not present

## 2020-05-08 DIAGNOSIS — K4090 Unilateral inguinal hernia, without obstruction or gangrene, not specified as recurrent: Secondary | ICD-10-CM | POA: Diagnosis not present

## 2020-05-08 LAB — SARS CORONAVIRUS 2 (TAT 6-24 HRS): SARS Coronavirus 2: NEGATIVE

## 2020-05-10 ENCOUNTER — Encounter: Payer: Self-pay | Admitting: Urology

## 2020-05-10 ENCOUNTER — Ambulatory Visit
Admission: RE | Admit: 2020-05-10 | Discharge: 2020-05-10 | Disposition: A | Discharge status: Home / Self Care | Payer: BC Managed Care – PPO | Attending: Urology | Admitting: Urology

## 2020-05-10 ENCOUNTER — Encounter
Admission: RE | Disposition: A | Discharge status: Home / Self Care | Payer: Self-pay | Source: Home / Self Care | Attending: Urology

## 2020-05-10 ENCOUNTER — Ambulatory Visit: Payer: BC Managed Care – PPO | Admitting: Certified Registered Nurse Anesthetist

## 2020-05-10 ENCOUNTER — Other Ambulatory Visit: Payer: Self-pay

## 2020-05-10 DIAGNOSIS — Z79899 Other long term (current) drug therapy: Secondary | ICD-10-CM | POA: Insufficient documentation

## 2020-05-10 DIAGNOSIS — Z791 Long term (current) use of non-steroidal anti-inflammatories (NSAID): Secondary | ICD-10-CM | POA: Insufficient documentation

## 2020-05-10 DIAGNOSIS — Z880 Allergy status to penicillin: Secondary | ICD-10-CM | POA: Insufficient documentation

## 2020-05-10 DIAGNOSIS — K4090 Unilateral inguinal hernia, without obstruction or gangrene, not specified as recurrent: Secondary | ICD-10-CM

## 2020-05-10 DIAGNOSIS — Z825 Family history of asthma and other chronic lower respiratory diseases: Secondary | ICD-10-CM | POA: Insufficient documentation

## 2020-05-10 DIAGNOSIS — Z20822 Contact with and (suspected) exposure to covid-19: Secondary | ICD-10-CM | POA: Insufficient documentation

## 2020-05-10 DIAGNOSIS — E7800 Pure hypercholesterolemia, unspecified: Secondary | ICD-10-CM | POA: Insufficient documentation

## 2020-05-10 HISTORY — PX: INGUINAL HERNIA REPAIR: SHX194

## 2020-05-10 SURGERY — REPAIR, HERNIA, INGUINAL, ADULT
Anesthesia: General | Laterality: Right

## 2020-05-10 MED ORDER — GLYCOPYRROLATE 0.2 MG/ML IJ SOLN: INTRAMUSCULAR | Status: DC | PRN

## 2020-05-10 MED ORDER — MIDAZOLAM HCL 2 MG/2ML IJ SOLN
INTRAMUSCULAR | Status: DC | PRN
  Administered 2020-05-10: 2 mg via INTRAVENOUS

## 2020-05-10 MED ORDER — ACETAMINOPHEN 10 MG/ML IV SOLN
INTRAVENOUS | Status: AC
  Filled 2020-05-10: qty 100

## 2020-05-10 MED ORDER — LEVOFLOXACIN IN D5W 500 MG/100ML IV SOLN
INTRAVENOUS | Status: AC
  Filled 2020-05-10: qty 100

## 2020-05-10 MED ORDER — DOCUSATE SODIUM 100 MG PO CAPS: 200.0000 mg | ORAL_CAPSULE | Freq: Two times a day (BID) | ORAL | 3 refills | Status: DC

## 2020-05-10 MED ORDER — CHLORHEXIDINE GLUCONATE 0.12 % MT SOLN
OROMUCOSAL | Status: AC
  Administered 2020-05-10: 07:00:00 15 mL via OROMUCOSAL
  Filled 2020-05-10: qty 15

## 2020-05-10 MED ORDER — FENTANYL CITRATE (PF) 100 MCG/2ML IJ SOLN
25.0000 ug | INTRAMUSCULAR | Status: DC | PRN
  Administered 2020-05-10 (×3): 25 ug via INTRAVENOUS

## 2020-05-10 MED ORDER — ORAL CARE MOUTH RINSE: 15.0000 mL | Freq: Once | OROMUCOSAL | Status: AC

## 2020-05-10 MED ORDER — PHENYLEPHRINE HCL (PRESSORS) 10 MG/ML IV SOLN
INTRAVENOUS | Status: AC
  Filled 2020-05-10: qty 1

## 2020-05-10 MED ORDER — LIDOCAINE HCL (CARDIAC) PF 100 MG/5ML IV SOSY
PREFILLED_SYRINGE | INTRAVENOUS | Status: DC | PRN
  Administered 2020-05-10: 100 mg via INTRAVENOUS

## 2020-05-10 MED ORDER — DEXAMETHASONE SODIUM PHOSPHATE 10 MG/ML IJ SOLN
INTRAMUSCULAR | Status: AC
  Filled 2020-05-10: qty 1

## 2020-05-10 MED ORDER — LEVOFLOXACIN IN D5W 500 MG/100ML IV SOLN
500.0000 mg | INTRAVENOUS | Status: DC
  Administered 2020-05-10: 08:00:00 500 mg via INTRAVENOUS

## 2020-05-10 MED ORDER — NEOMYCIN-POLYMYXIN B GU 40-200000 IR SOLN
Status: AC
  Filled 2020-05-10: qty 2

## 2020-05-10 MED ORDER — KETOROLAC TROMETHAMINE 30 MG/ML IJ SOLN
INTRAMUSCULAR | Status: AC
  Filled 2020-05-10: qty 1

## 2020-05-10 MED ORDER — FENTANYL CITRATE (PF) 100 MCG/2ML IJ SOLN
INTRAMUSCULAR | Status: AC
  Administered 2020-05-10: 09:00:00 25 ug via INTRAVENOUS
  Filled 2020-05-10: qty 2

## 2020-05-10 MED ORDER — ACETAMINOPHEN 160 MG/5ML PO SOLN
325.0000 mg | ORAL | Status: DC | PRN
  Filled 2020-05-10: qty 20.3

## 2020-05-10 MED ORDER — MIDAZOLAM HCL 2 MG/2ML IJ SOLN
INTRAMUSCULAR | Status: AC
  Filled 2020-05-10: qty 2

## 2020-05-10 MED ORDER — GLYCOPYRROLATE 0.2 MG/ML IJ SOLN
INTRAMUSCULAR | Status: AC
  Filled 2020-05-10: qty 1

## 2020-05-10 MED ORDER — ACETAMINOPHEN-CODEINE #3 300-30 MG PO TABS: 1.0000 | ORAL_TABLET | ORAL | 0 refills | Status: DC | PRN

## 2020-05-10 MED ORDER — PROMETHAZINE HCL 25 MG/ML IJ SOLN: 6.2500 mg | INTRAMUSCULAR | Status: DC | PRN

## 2020-05-10 MED ORDER — SULFAMETHOXAZOLE-TRIMETHOPRIM 800-160 MG PO TABS: 1.0000 | ORAL_TABLET | Freq: Two times a day (BID) | ORAL | 0 refills | Status: DC

## 2020-05-10 MED ORDER — FENTANYL CITRATE (PF) 100 MCG/2ML IJ SOLN
INTRAMUSCULAR | Status: AC
  Filled 2020-05-10: qty 2

## 2020-05-10 MED ORDER — EPHEDRINE SULFATE 50 MG/ML IJ SOLN
INTRAMUSCULAR | Status: DC | PRN
  Administered 2020-05-10 (×2): 10 mg via INTRAVENOUS
  Administered 2020-05-10 (×2): 5 mg via INTRAVENOUS
  Administered 2020-05-10 (×2): 10 mg via INTRAVENOUS

## 2020-05-10 MED ORDER — HYDROCODONE-ACETAMINOPHEN 7.5-325 MG PO TABS: 1.0000 | ORAL_TABLET | Freq: Once | ORAL | Status: DC | PRN

## 2020-05-10 MED ORDER — KETOROLAC TROMETHAMINE 30 MG/ML IJ SOLN
INTRAMUSCULAR | Status: DC | PRN
  Administered 2020-05-10: 30 mg via INTRAVENOUS

## 2020-05-10 MED ORDER — ACETAMINOPHEN 10 MG/ML IV SOLN
INTRAVENOUS | Status: DC | PRN
  Administered 2020-05-10: 1000 mg via INTRAVENOUS

## 2020-05-10 MED ORDER — ACETAMINOPHEN 325 MG PO TABS: 325.0000 mg | ORAL_TABLET | ORAL | Status: DC | PRN

## 2020-05-10 MED ORDER — FENTANYL CITRATE (PF) 100 MCG/2ML IJ SOLN
INTRAMUSCULAR | Status: DC | PRN
  Administered 2020-05-10: 50 ug via INTRAVENOUS
  Administered 2020-05-10: 25 ug via INTRAVENOUS
  Administered 2020-05-10: 50 ug via INTRAVENOUS
  Administered 2020-05-10 (×3): 25 ug via INTRAVENOUS

## 2020-05-10 MED ORDER — LIDOCAINE-EPINEPHRINE 1 %-1:100000 IJ SOLN
INTRAMUSCULAR | Status: AC
  Filled 2020-05-10: qty 1

## 2020-05-10 MED ORDER — LIDOCAINE HCL (PF) 2 % IJ SOLN
INTRAMUSCULAR | Status: AC
  Filled 2020-05-10: qty 10

## 2020-05-10 MED ORDER — LACTATED RINGERS IV SOLN: INTRAVENOUS | Status: DC

## 2020-05-10 MED ORDER — FAMOTIDINE 20 MG PO TABS
ORAL_TABLET | ORAL | Status: AC
  Administered 2020-05-10: 06:00:00 20 mg
  Filled 2020-05-10: qty 1

## 2020-05-10 MED ORDER — DEXAMETHASONE SODIUM PHOSPHATE 10 MG/ML IJ SOLN
INTRAMUSCULAR | Status: DC | PRN
  Administered 2020-05-10: 10 mg via INTRAVENOUS

## 2020-05-10 MED ORDER — BUPIVACAINE HCL (PF) 0.5 % IJ SOLN
INTRAMUSCULAR | Status: AC
  Filled 2020-05-10: qty 30

## 2020-05-10 MED ORDER — PROPOFOL 10 MG/ML IV BOLUS
INTRAVENOUS | Status: AC
  Filled 2020-05-10: qty 40

## 2020-05-10 MED ORDER — ONDANSETRON HCL 4 MG/2ML IJ SOLN
INTRAMUSCULAR | Status: AC
  Filled 2020-05-10: qty 2

## 2020-05-10 MED ORDER — ONDANSETRON HCL 4 MG/2ML IJ SOLN
INTRAMUSCULAR | Status: DC | PRN
  Administered 2020-05-10: 4 mg via INTRAVENOUS

## 2020-05-10 MED ORDER — EPHEDRINE 5 MG/ML INJ
INTRAVENOUS | Status: AC
  Filled 2020-05-10: qty 10

## 2020-05-10 MED ORDER — PROPOFOL 10 MG/ML IV BOLUS
INTRAVENOUS | Status: DC | PRN
  Administered 2020-05-10: 200 mg via INTRAVENOUS

## 2020-05-10 MED ORDER — CHLORHEXIDINE GLUCONATE 0.12 % MT SOLN: 15.0000 mL | Freq: Once | OROMUCOSAL | Status: AC

## 2020-05-10 SURGICAL SUPPLY — 43 items
ADH SKN CLS APL DERMABOND .7 (GAUZE/BANDAGES/DRESSINGS) ×1
BLADE SURG 15 STRL LF DISP TIS (BLADE) ×1 IMPLANT
BLADE SURG 15 STRL SS (BLADE) ×3
CANISTER SUCT 1200ML W/VALVE (MISCELLANEOUS) ×3 IMPLANT
CLOSURE WOUND 1/2 X4 (GAUZE/BANDAGES/DRESSINGS) ×1
COVER WAND RF STERILE (DRAPES) ×3 IMPLANT
DERMABOND ADVANCED (GAUZE/BANDAGES/DRESSINGS) ×2
DERMABOND ADVANCED .7 DNX12 (GAUZE/BANDAGES/DRESSINGS) ×1 IMPLANT
DRAIN PENROSE 1/4X12 LTX STRL (WOUND CARE) ×3 IMPLANT
DRAPE LAPAROTOMY 100X77 ABD (DRAPES) ×3 IMPLANT
DRSG GAUZE FLUFF 36X18 (GAUZE/BANDAGES/DRESSINGS) ×3 IMPLANT
DRSG TEGADERM 4X4.75 (GAUZE/BANDAGES/DRESSINGS) ×3 IMPLANT
DRSG TELFA 4X3 1S NADH ST (GAUZE/BANDAGES/DRESSINGS) ×3 IMPLANT
DURAPREP 26ML APPLICATOR (WOUND CARE) ×3 IMPLANT
ELECT REM PT RETURN 9FT ADLT (ELECTROSURGICAL) ×3
ELECTRODE REM PT RTRN 9FT ADLT (ELECTROSURGICAL) ×1 IMPLANT
GLOVE BIO SURGEON STRL SZ7.5 (GLOVE) ×3 IMPLANT
GOWN STRL REUS W/ TWL LRG LVL3 (GOWN DISPOSABLE) ×1 IMPLANT
GOWN STRL REUS W/ TWL XL LVL3 (GOWN DISPOSABLE) ×1 IMPLANT
GOWN STRL REUS W/TWL LRG LVL3 (GOWN DISPOSABLE) ×3
GOWN STRL REUS W/TWL XL LVL3 (GOWN DISPOSABLE) ×3
KIT TURNOVER KIT A (KITS) ×3 IMPLANT
LABEL OR SOLS (LABEL) ×3 IMPLANT
MANIFOLD NEPTUNE II (INSTRUMENTS) ×3 IMPLANT
MESH HERNIA 4.5X10 SYS PRO LRG (Mesh General) IMPLANT
MESH HERNIA SYS PROLENE LG (Mesh General) ×2 IMPLANT
NDL HYPO 25X1 1.5 SAFETY (NEEDLE) ×1 IMPLANT
NEEDLE HYPO 25X1 1.5 SAFETY (NEEDLE) ×3 IMPLANT
NS IRRIG 500ML POUR BTL (IV SOLUTION) ×3 IMPLANT
PACK BASIN MINOR ARMC (MISCELLANEOUS) ×3 IMPLANT
SPONGE KITTNER 5P (MISCELLANEOUS) ×3 IMPLANT
STRIP CLOSURE SKIN 1/2X4 (GAUZE/BANDAGES/DRESSINGS) ×2 IMPLANT
SUPPORT SCROTAL LG STRP (MISCELLANEOUS) ×2 IMPLANT
SUPPORTER ATHLETIC LG (MISCELLANEOUS) ×1
SUT CHROMIC 3-0 (SUTURE) ×3
SUT CHROMIC 3-0 54XMFL REEL CR (SUTURE) ×1
SUT PLAIN 3 0 SH 27IN (SUTURE) ×3 IMPLANT
SUT SURGILON 0 BLK (SUTURE) IMPLANT
SUT VIC AB 4-0 PS2 18 (SUTURE) ×3 IMPLANT
SUTURE CHRMC 3-0 54XMFL REL CR (SUTURE) ×1 IMPLANT
SWABSTK COMLB BENZOIN TINCTURE (MISCELLANEOUS) ×3 IMPLANT
SYR 10ML LL (SYRINGE) ×3 IMPLANT
SYR BULB IRRIG 60ML STRL (SYRINGE) ×3 IMPLANT

## 2020-05-10 NOTE — Op Note (Signed)
Preoperative diagnosis: Right inguinal hernia (K40.90)  Postoperative diagnosis: Same  Procedure: Right inguinal herniorrhaphy (CPT [phone])  Surgeon: Otelia Limes. Yves Dill MD  Anesthesia: General  Indications:See the history and physical. After informed consent the above procedure(s) were requested     Technique and findings: After adequate general anesthesia obtained patient's lower abdomen and perineum were prepped and draped in usual fashion.  A 3 cm right inguinal crease incision was made carried down sharply through the skin and through the subcutaneous fatty tissue with cautery.  The external oblique fibers were identified and divided along their course.  The spermatic cord was freed of any structures and delivered into the surgical field.  Sterritt fibers were divided and the hernia sac separated from the cord structures.  The hernia sac was dissected back to the internal ring and ligated at its base with 0 Surgilon suture.  Redundant hernia sac was excised and submitted.  The retropubic space was then developed using blunt finger tip dissection.  Retropubic space was irrigated with GU irrigant.  Ethicon PHS the graft was selected and circular portion unfurled into the retropubic space.  Oblong external portion the graft was then placed beneath the turn oblique fascia parallel to the fibers.  A keyhole incision was made laterally in the oblong section of the graft and the edges brought around the cord structures and anchored to the inguinal ligament with 0 Surgilon suture.  The spermatic cord was then placed back into its normal anatomic position.  Spermatic cord block was performed with 1% Xylocaine.  The extra oblique fascial fibers were then reapproximated over the spermatic cord with interrupted 0 Surgilon suture.  Subcutaneous lock was performed with mixture of 1% Xylocaine with epinephrine and and 0.5% Marcaine.  The subcutaneous fatty tissue was reapproximated with interrupted 3-0 catgut and  skin was approximated with 4-0 Vicryl subcuticular closure.  Dermabond, benzoin, Steri-Strips and sterile dressing were applied.  The sponge, needle, instrument counts were noted to be correct.  Blood loss was minimal.  The procedure was then terminated and patient transferred to the recovery room in stable condition.

## 2020-05-10 NOTE — H&P (Signed)
Date of Initial H&P: 05/01/20  History reviewed, patient examined, no change in status, stable for surgery.

## 2020-05-10 NOTE — Anesthesia Preprocedure Evaluation (Addendum)
Anesthesia Evaluation  Patient identified by MRN, date of birth, ID band Patient awake    Reviewed: Allergy & Precautions, H&P , NPO status , reviewed documented beta blocker date and time   Airway Mallampati: II  TM Distance: >3 FB Neck ROM: full    Dental  (+) Caps   Pulmonary    Pulmonary exam normal        Cardiovascular Normal cardiovascular exam     Neuro/Psych PSYCHIATRIC DISORDERS Anxiety    GI/Hepatic neg GERD  ,  Endo/Other    Renal/GU      Musculoskeletal  (+) Arthritis ,   Abdominal   Peds  Hematology   Anesthesia Other Findings Past Medical History: No date: Actinic keratosis No date: Anxiety No date: Degenerative joint disease No date: Hypercholesterolemia No date: Plantar fasciitis No date: Right inguinal hernia No date: Tendinitis of right hip Past Surgical History: No date: COLONOSCOPY No date: NASAL SEPTUM SURGERY No date: TONSILLECTOMY BMI    Body Mass Index: 24.41 kg/m     Reproductive/Obstetrics                            Anesthesia Physical Anesthesia Plan  ASA: II  Anesthesia Plan: General   Post-op Pain Management:    Induction: Intravenous  PONV Risk Score and Plan: 2 and Ondansetron, Treatment may vary due to age or medical condition and Midazolam  Airway Management Planned: LMA  Additional Equipment:   Intra-op Plan:   Post-operative Plan: Extubation in OR  Informed Consent: I have reviewed the patients History and Physical, chart, labs and discussed the procedure including the risks, benefits and alternatives for the proposed anesthesia with the patient or authorized representative who has indicated his/her understanding and acceptance.     Dental Advisory Given  Plan Discussed with: CRNA  Anesthesia Plan Comments:        Anesthesia Quick Evaluation

## 2020-05-10 NOTE — Anesthesia Postprocedure Evaluation (Signed)
Anesthesia Post Note  Patient: Alex Burch  Procedure(s) Performed: HERNIA REPAIR INGUINAL ADULT (Right )  Patient location during evaluation: PACU Anesthesia Type: General Level of consciousness: awake and alert Pain management: pain level controlled Vital Signs Assessment: post-procedure vital signs reviewed and stable Respiratory status: spontaneous breathing, nonlabored ventilation and respiratory function stable Cardiovascular status: blood pressure returned to baseline and stable Postop Assessment: no apparent nausea or vomiting Anesthetic complications: no   No complications documented.   Last Vitals:  Vitals:   05/10/20 0954 05/10/20 1056  BP: 135/68 132/68  Pulse: (!) 58 (!) 57  Resp: 18   Temp: 36.7 C   SpO2: 99% 99%    Last Pain:  Vitals:   05/10/20 1056  TempSrc:   PainSc: 2                  Alphonsus Sias

## 2020-05-10 NOTE — Discharge Instructions (Addendum)
1)       Hernia, Adult     A hernia happens when tissue inside your body pushes out through a weak spot in your belly muscles (abdominal wall). This makes a round lump (bulge). The lump may be:  In a scar from surgery that was done in your belly (incisional hernia).  Near your belly button (umbilical hernia).  In your groin (inguinal hernia). Your groin is the area where your leg meets your lower belly (abdomen). This kind of hernia could also be: ? In your scrotum, if you are male. ? In folds of skin around your vagina, if you are male.  In your upper thigh (femoral hernia).  Inside your belly (hiatal hernia). This happens when your stomach slides above the muscle between your belly and your chest (diaphragm). If your hernia is small and it does not cause pain, you may not need treatment. If your hernia is large or it causes pain, you may need surgery. Follow these instructions at home: Activity  Avoid stretching or overusing (straining) the muscles near your hernia. Straining can happen when you: ? Lift something heavy. ? Poop (have a bowel movement).  Do not lift anything that is heavier than 10 lb (4.5 kg), or the limit that you are told, until your doctor says that it is safe.  Use the strength of your legs when you lift something heavy. Do not use only your back muscles to lift. General instructions  Do these things if told by your doctor so you do not have trouble pooping (constipation): ? Drink enough fluid to keep your pee (urine) pale yellow. ? Eat foods that are high in fiber. These include fresh fruits and vegetables, whole grains, and beans. ? Limit foods that are high in fat and processed sugars. These include foods that are fried or sweet. ? Take medicine for trouble pooping.  When you cough, try to cough gently.  You may try to push your hernia in by very gently pressing on it when you are lying down. Do not try to force the bulge back in if it will  not push in easily.  If you are overweight, work with your doctor to lose weight safely.  Do not use any products that have nicotine or tobacco in them. These include cigarettes and e-cigarettes. If you need help quitting, ask your doctor.  If you will be having surgery (hernia repair), watch your hernia for changes in shape, size, or color. Tell your doctor if you see any changes.  Take over-the-counter and prescription medicines only as told by your doctor.  Keep all follow-up visits as told by your doctor. Contact a doctor if:  You get new pain, swelling, or redness near your hernia.  You poop fewer times in a week than normal.  You have trouble pooping.  You have poop (stool) that is more dry than normal.  You have poop that is harder or larger than normal. Get help right away if:  You have a fever.  You have belly pain that gets worse.  You feel sick to your stomach (nauseous).  You throw up (vomit).  Your hernia cannot be pushed in by very gently pressing on it when you are lying down. Do not try to force the bulge back in if it will not push in easily.  Your hernia: ? Changes in shape or size. ? Changes color. ? Feels hard or it hurts when you touch it. These symptoms may represent a  serious problem that is an emergency. Do not wait to see if the symptoms will go away. Get medical help right away. Call your local emergency services (911 in the [place] Summary  A hernia happens when tissue inside your body pushes out through a weak spot in the belly muscles. This creates a bulge.  If your hernia is small and it does not hurt, you may not need treatment. If your hernia is large or it hurts, you may need surgery.  If you will be having surgery, watch your hernia for changes in shape, size, or color. Tell your doctor about any changes. This information is not intended to replace advice given to you by your health care provider. Make sure you discuss any questions you  have with your health care provider. Document Revised: 09/02/2018 Document Reviewed: 02/11/2017 Elsevier Patient Education  [employer], Adult, Care After These instructions give you information about caring for yourself after your procedure. Your doctor may also give you more specific instructions. If you have problems or questions, contact your doctor. Follow these instructions at home: Surgical cut (incision) care   Follow instructions from your doctor about how to take care of your surgical cut area. Make sure you: ? Wash your hands with soap and water before you change your bandage (dressing). If you cannot use soap and water, use hand sanitizer. ? Change your bandage as told by your doctor. ? Leave stitches (sutures), skin glue, or skin tape (adhesive) strips in place. They may need to stay in place for 2 weeks or longer. If tape strips get loose and curl up, you may trim the loose edges. Do not remove tape strips completely unless your doctor says it is okay.  Check your surgical cut every day for signs of infection. Check for: ? More redness, swelling, or pain. ? More fluid or blood. ? Warmth. ? Pus or a bad smell. Activity  Do not drive or use heavy machinery while taking prescription pain medicine. Do not drive until your doctor says it is okay.  Until your doctor says it is okay: ? Do not lift anything that is heavier than 10 lb (4.5 kg). ? Do not play contact sports.  Return to your normal activities as told by your doctor. Ask your doctor what activities are safe. General instructions  To prevent or treat having a hard time pooping (constipation) while you are taking prescription pain medicine, your doctor may recommend that you: ? Drink enough fluid to keep your pee (urine) clear or pale yellow. ? Take over-the-counter or prescription medicines. ? Eat foods that are high in fiber, such as fresh fruits and vegetables, whole grains, and  beans. ? Limit foods that are high in fat and processed sugars, such as fried and sweet foods.  Take over-the-counter and prescription medicines only as told by your doctor.  Do not take baths, swim, or use a hot tub until your doctor says it is okay.  Keep all follow-up visits as told by your doctor. This is important. Contact a doctor if:  You develop a rash.  You have more redness, swelling, or pain around your surgical cut.  You have more fluid or blood coming from your surgical cut.  Your surgical cut feels warm to the touch.  You have pus or a bad smell coming from your surgical cut.  You have a fever or chills.  You have blood in your poop (stool).  You have not  pooped in 2-3 days.  Medicine does not help your pain. Get help right away if:  You have chest pain or you are short of breath.  You feel light-headed.  You feel weak and dizzy (feel faint).  You have very bad pain.  You throw up (vomit) and your pain is worse. This information is not intended to replace advice given to you by your health care provider. Make sure you discuss any questions you have with your health care provider. Document Revised: 09/03/2018 Document Reviewed: 10/24/2015 Elsevier Patient Education  2020 [place]   2) The drugs that you were given will stay in your system until tomorrow so for the next 24 hours you should not:  A) Drive an automobile B) Make any legal decisions C) Drink any alcoholic beverage   3) You may resume regular meals tomorrow.  Today it is better to start with liquids and gradually work up to solid foods.  You may eat anything you prefer, but it is better to start with liquids, then soup and crackers, and gradually work up to solid foods.   4) Please notify your doctor immediately if you have any unusual bleeding, trouble breathing, redness and pain at the surgery site, drainage, fever, or pain not  relieved by medication.    5) Additional Instructions:        Please contact your physician with any problems or Same Day Surgery at [phone], Monday through Friday 6 am to 4 pm, or [place] at [facility] number at [phone].

## 2020-05-10 NOTE — Anesthesia Procedure Notes (Signed)
Procedure Name: LMA Insertion Performed by: Jonny Dearden, CRNA Pre-anesthesia Checklist: Patient identified, Patient being monitored, Timeout performed, Emergency Drugs available and Suction available Patient Re-evaluated:Patient Re-evaluated prior to induction Oxygen Delivery Method: Circle system utilized Preoxygenation: Pre-oxygenation with 100% oxygen Induction Type: IV induction Ventilation: Mask ventilation without difficulty LMA: LMA inserted LMA Size: 4.5 Tube type: Oral Number of attempts: 1 Placement Confirmation: positive ETCO2 and breath sounds checked- equal and bilateral Tube secured with: Tape Dental Injury: Teeth and Oropharynx as per pre-operative assessment        

## 2020-05-10 NOTE — Transfer of Care (Signed)
Immediate Anesthesia Transfer of Care Note   Patient: Alex Burch  Procedure(s) Performed: HERNIA REPAIR INGUINAL ADULT (Right )  Patient Location: PACU  Anesthesia Type:General  Level of Consciousness: awake, alert  and oriented  Airway & Oxygen Therapy: Patient Spontanous Breathing and Patient connected to face mask oxygen  Post-op Assessment: Report given to RN and Post -op Vital signs reviewed and stable  Post vital signs: Reviewed and stable  Last Vitals:  Vitals Value Taken Time  BP 137/78 05/10/20 0904  Temp    Pulse 78 05/10/20 0906  Resp 13 05/10/20 0906  SpO2 98 % 05/10/20 0906  Vitals shown include unvalidated device data.  Last Pain:  Vitals:   05/10/20 0612  TempSrc: Temporal  PainSc: 0-No pain         Complications: No complications documented.

## 2020-05-11 LAB — SURGICAL PATHOLOGY

## 2021-04-10 ENCOUNTER — Encounter: Payer: BC Managed Care – PPO | Admitting: Dermatology

## 2021-11-14 ENCOUNTER — Encounter: Payer: Self-pay | Admitting: Podiatry

## 2021-11-14 ENCOUNTER — Ambulatory Visit (INDEPENDENT_AMBULATORY_CARE_PROVIDER_SITE_OTHER): Payer: BC Managed Care – PPO | Admitting: Podiatry

## 2021-11-14 DIAGNOSIS — Q828 Other specified congenital malformations of skin: Secondary | ICD-10-CM | POA: Diagnosis not present

## 2021-11-14 DIAGNOSIS — M7752 Other enthesopathy of left foot: Secondary | ICD-10-CM | POA: Diagnosis not present

## 2021-11-14 MED ORDER — TRIAMCINOLONE ACETONIDE 10 MG/ML IJ SUSP
10.0000 mg | Freq: Once | INTRAMUSCULAR | Status: AC
  Administered 2021-11-14: 10 mg

## 2021-12-12 ENCOUNTER — Ambulatory Visit (INDEPENDENT_AMBULATORY_CARE_PROVIDER_SITE_OTHER): Payer: BC Managed Care – PPO | Admitting: Dermatology

## 2021-12-12 DIAGNOSIS — L570 Actinic keratosis: Secondary | ICD-10-CM

## 2021-12-12 DIAGNOSIS — L814 Other melanin hyperpigmentation: Secondary | ICD-10-CM

## 2021-12-12 DIAGNOSIS — Z1283 Encounter for screening for malignant neoplasm of skin: Secondary | ICD-10-CM | POA: Diagnosis not present

## 2021-12-12 DIAGNOSIS — D1800 Hemangioma unspecified site: Secondary | ICD-10-CM

## 2021-12-12 DIAGNOSIS — L578 Other skin changes due to chronic exposure to nonionizing radiation: Secondary | ICD-10-CM | POA: Diagnosis not present

## 2021-12-12 DIAGNOSIS — D229 Melanocytic nevi, unspecified: Secondary | ICD-10-CM | POA: Diagnosis not present

## 2021-12-12 DIAGNOSIS — L821 Other seborrheic keratosis: Secondary | ICD-10-CM

## 2021-12-12 DIAGNOSIS — I8393 Asymptomatic varicose veins of bilateral lower extremities: Secondary | ICD-10-CM

## 2021-12-12 MED ORDER — FLUOROURACIL 5 % EX CREA: TOPICAL_CREAM | Freq: Two times a day (BID) | CUTANEOUS | 1 refills | Status: DC

## 2021-12-12 NOTE — Patient Instructions (Addendum)
Elta MD Clear for Face - Sunscreen    Actinic keratoses are precancerous spots that appear secondary to cumulative UV radiation exposure/sun exposure over time. They are chronic with expected duration over 1 year. A portion of actinic keratoses will progress to squamous cell carcinoma of the skin. It is not possible to reliably predict which spots will progress to skin cancer and so treatment is recommended to prevent development of skin cancer.  Recommend daily broad spectrum sunscreen SPF 30+ to sun-exposed areas, reapply every 2 hours as needed.  Recommend staying in the shade or wearing long sleeves, sun glasses (UVA+UVB protection) and wide brim hats (4-inch brim around the entire circumference of the hat). Call for new or changing lesions.   Cryotherapy Aftercare  Wash gently with soap and water everyday.   Apply Vaseline and Band-Aid daily until healed.   Start Treatment September 1   Start 5-fluorouracil/calcipotriene cream twice a day for 7 days to affected areas including left jaw, left sideburn, left cheek . Prescription sent to [facility]. Patient provided with contact information for pharmacy and advised the pharmacy will mail the prescription to their home. Patient provided with handout reviewing treatment course and side effects and advised to call or message [place] on MyChart with any concerns.    5-Fluorouracil/Calcipotriene Patient Education   Actinic keratoses are the dry, red scaly spots on the skin caused by sun damage. A portion of these spots can turn into skin cancer with time, and treating them can help prevent development of skin cancer.   Treatment of these spots requires removal of the defective skin cells. There are various ways to remove actinic keratoses, including freezing with liquid nitrogen, treatment with creams, or treatment with a blue light procedure in the office.   5-fluorouracil cream is a topical cream used to treat actinic keratoses. It works  by interfering with the growth of abnormal fast-growing skin cells, such as actinic keratoses. These cells peel off and are replaced by healthy ones.   5-fluorouracil/calcipotriene is a combination of the 5-fluorouracil cream with a vitamin D analog cream called calcipotriene. The calcipotriene alone does not treat actinic keratoses. However, when it is combined with 5-fluorouracil, it helps the 5-fluorouracil treat the actinic keratoses much faster so that the same results can be achieved with a much shorter treatment time.  INSTRUCTIONS FOR 5-FLUOROURACIL/CALCIPOTRIENE CREAM:   5-fluorouracil/calcipotriene cream typically only needs to be used for 4-7 days. A thin layer should be applied twice a day to the treatment areas recommended by your physician.   If your physician prescribed you separate tubes of 5-fluourouracil and calcipotriene, apply a thin layer of 5-fluorouracil followed by a thin layer of calcipotriene.   Avoid contact with your eyes, nostrils, and mouth. Do not use 5-fluorouracil/calcipotriene cream on infected or open wounds.   You will develop redness, irritation and some crusting at areas where you have pre-cancer damage/actinic keratoses. IF YOU DEVELOP PAIN, BLEEDING, OR SIGNIFICANT CRUSTING, STOP THE TREATMENT EARLY - you have already gotten a good response and the actinic keratoses should clear up well.  Wash your hands after applying 5-fluorouracil 5% cream on your skin.   A moisturizer or sunscreen with a minimum SPF 30 should be applied each morning.   Once you have finished the treatment, you can apply a thin layer of Vaseline twice a day to irritated areas to soothe and calm the areas more quickly. If you experience significant discomfort, contact your physician.  For some patients it is necessary to  repeat the treatment for best results.  SIDE EFFECTS: When using 5-fluorouracil/calcipotriene cream, you may have mild irritation, such as redness, dryness, swelling,  or a mild burning sensation. This usually resolves within 2 weeks. The more actinic keratoses you have, the more redness and inflammation you can expect during treatment. Eye irritation has been reported rarely. If this occurs, please let us know.  If you have any trouble using this cream, please call the office. If you have any other questions about this information, please do not hesitate to ask me before you leave the office.  Seborrheic Keratosis  What causes seborrheic keratoses? Seborrheic keratoses are harmless, common skin growths that first appear during adult life.  As time goes by, more growths appear.  Some people may develop a large number of them.  Seborrheic keratoses appear on both covered and uncovered body parts.  They are not caused by sunlight.  The tendency to develop seborrheic keratoses can be inherited.  They vary in color from skin-colored to gray, brown, or even black.  They can be either smooth or have a rough, warty surface.   Seborrheic keratoses are superficial and look as if they were stuck on the skin.  Under the microscope this type of keratosis looks like layers upon layers of skin.  That is why at times the top layer may seem to fall off, but the rest of the growth remains and re-grows.    Treatment Seborrheic keratoses do not need to be treated, but can easily be removed in the office.  Seborrheic keratoses often cause symptoms when they rub on clothing or jewelry.  Lesions can be in the way of shaving.  If they become inflamed, they can cause itching, soreness, or burning.  Removal of a seborrheic keratosis can be accomplished by freezing, burning, or surgery. If any spot bleeds, scabs, or grows rapidly, please return to have it checked, as these can be an indication of a skin cancer.Discussed sclerotherapy for spider vein treatment.  Discussed that it is a cosmetic procedure and is not covered by insurance ($350/treatment).  Treatment consists of injecting the spider  veins with a medicine called Asclera to help them disappear.  Multiple treatments are generally necessary to get best results.  Risks including bruising and persistent discoloration due to post-inflammatory hyperpigmentation.  Sclerotherapy does not prevent the development of new spider veins.  Daily compression hose for two weeks after procedure is recommended.   Melanoma ABCDEs  Melanoma is the most dangerous type of skin cancer, and is the leading cause of death from skin disease.  You are more likely to develop melanoma if you: Have light-colored skin, light-colored eyes, or red or blond hair Spend a lot of time in the sun Tan regularly, either outdoors or in a tanning bed Have had blistering sunburns, especially during childhood Have a close family member who has had a melanoma Have atypical moles or large birthmarks  Early detection of melanoma is key since treatment is typically straightforward and cure rates are extremely high if we catch it early.   The first sign of melanoma is often a change in a mole or a new dark spot.  The ABCDE system is a way of remembering the signs of melanoma.  A for asymmetry:  The two halves do not match. B for border:  The edges of the growth are irregular. C for color:  A mixture of colors are present instead of an even brown color. D for diameter:  Melanomas are  usually (but not always) greater than 8m - the size of a pencil eraser. E for evolution:  The spot keeps changing in size, shape, and color.  Please check your skin once per month between visits. You can use a small mirror in front and a large mirror behind you to keep an eye on the back side or your body.   If you see any new or changing lesions before your next follow-up, please call to schedule a visit.  Please continue daily skin protection including broad spectrum sunscreen SPF 30+ to sun-exposed areas, reapplying every 2 hours as needed when you're outdoors.   Staying in the shade or  wearing long sleeves, sun glasses (UVA+UVB protection) and wide brim hats (4-inch brim around the entire circumference of the hat) are also recommended for sun protection.          Due to recent changes in healthcare laws, you may see results of your pathology and/or laboratory studies on MyChart before the doctors have had a chance to review them. We understand that in some cases there may be results that are confusing or concerning to you. Please understand that not all results are received at the same time and often the doctors may need to interpret multiple results in order to provide you with the best plan of care or course of treatment. Therefore, we ask that you please give u[place]2 business days to thoroughly review all your results before contacting the office for clarification. Should we see a critical lab result, you will be contacted sooner.   If You Need Anything After Your Visit  If you have any questions or concerns for your doctor, please call our main line at [phone] press option 4 to reach your doctor's medical assistant. If no one answers, please leave a voicemail as directed and we will return your call as soon as possible. Messages left after 4 pm will be answered the following business day.   You may also send [place] message via [place] We typically respond to MyChart messages within 1-2 business days.  For prescription refills, please ask your pharmacy to contact our office. Our fax number is [phone]  If you have an urgent issue when the clinic is closed that cannot wait until the next business day, you can page your doctor at the number below.    Please note that while we do our best to be available for urgent issues outside of office hours, we are not available 24/7.   If you have an urgent issue and are unable to reach u[place] you may choose to seek medical care at your doctor's office, retail clinic, urgent care center, or emergency room.  If you have a  medical emergency, please immediately call 911 or go to the emergency department.  Pager Numbers  - Dr. KNehemiah Massed [phone] - Dr. MLaurence Ferrari [phone] - Dr. SNicole Kindred [phone] In the event of inclement weather, please call our main line at [phone] an update on the status of any delays or closures.  Dermatology Medication Tips: Please keep the boxes that topical medications come in in order to help keep track of the instructions about where and how to use these. Pharmacies typically print the medication instructions only on the boxes and not directly on the medication tubes.   If your medication is too expensive, please contact our office at [phone] 4 or send u[place] message through [facility]   We are unable to tell what  your co-pay for medications will be in advance as this is different depending on your insurance coverage. However, we may be able to find a substitute medication at lower cost or fill out paperwork to get insurance to cover a needed medication.   If a prior authorization is required to get your medication covered by your insurance company, please allow [place] 1-2 business days to complete this process.  Drug prices often vary depending on where the prescription is filled and some pharmacies may offer cheaper prices.  The website www.goodrx.com contains coupons for medications through different pharmacies. The prices here do not account for what the cost may be with help from insurance (it may be cheaper with your insurance), but the website can give you the price if you did not use any insurance.  - You can print the associated coupon and take it with your prescription to the pharmacy.  - You may also stop by our office during regular business hours and pick up a GoodRx coupon card.  - If you need your prescription sent electronically to a different pharmacy, notify our office through [facility] or by phone at [phone] option 4.     Si  Usted Necesita Algo Despus de Su Visita  Tambin puede enviarnos un mensaje a travs de Pharmacist, community. Por lo general respondemos a los mensajes de MyChart en el transcurso de 1 a 2 das hbiles.  Para renovar recetas, por favor pida a su farmacia que se ponga en contacto con nuestra oficina. Harland Dingwall de fax es [place] [phone].  Si tiene un asunto urgente cuando la clnica est cerrada y que no puede esperar hasta el siguiente da hbil, puede llamar/localizar a su doctor(a) al nmero que aparece a continuacin.   Por favor, tenga en cuenta que aunque hacemos todo lo posible para estar disponibles para asuntos urgentes fuera del horario de [place], no estamos disponibles las 24 horas del da, los 7 das de la [place].   Si tiene un problema urgente y no puede comunicarse con nosotros, puede optar por buscar atencin mdica  en el consultorio de su doctor(a), en una clnica privada, en un centro de atencin urgente o en una sala de emergencias.  Si tiene Engineering geologist, por favor llame inmediatamente al 911 o vaya a la sala de emergencias.  Nmeros de bper  - Dr. Nehemiah Massed: [phone]  - Dra. Moye: [phone]  - Dra. Nicole Kindred: [phone]  En caso de inclemencias del [place], por favor llame a Johnsie Kindred principal al [phone] para una actualizacin sobre el [place] de cualquier retraso o cierre.  Consejos para la medicacin en dermatologa: Por favor, guarde las cajas en las que vienen los medicamentos de uso tpico para ayudarle a seguir las instrucciones sobre dnde y cmo usarlos. Las farmacias generalmente imprimen las instrucciones del medicamento slo en las cajas y no directamente en los tubos del [place].   Si su medicamento es muy caro, por favor, pngase en contacto con Zigmund Daniel llamando al [phone] y presione la opcin 4 o envenos un mensaje a travs de Pharmacist, community.   No podemos decirle cul ser su copago por los medicamentos por adelantado ya que  esto es diferente dependiendo de la cobertura de su seguro. Sin embargo, es posible que podamos encontrar un medicamento sustituto a Electrical engineer un formulario para que el seguro cubra el medicamento que se considera necesario.   Si se requiere una autorizacin previa para que su compaa de seguros [place] su medicamento,  por favor permtanos de 1 a 2 das hbiles para completar este proceso.  Los precios de los medicamentos varan con frecuencia dependiendo del Environmental consultant de dnde se surte la receta y alguna farmacias pueden ofrecer precios ms baratos.  El sitio web www.goodrx.com tiene cupones para medicamentos de Airline pilot. Los precios aqu no tienen en cuenta lo que podra costar con la ayuda del seguro (puede ser ms barato con su seguro), pero el sitio web puede darle el precio si no utiliz Research scientist (physical sciences).  - Puede imprimir el cupn correspondiente y llevarlo con su receta a la farmacia.  - Tambin puede pasar por nuestra oficina durante el horario de atencin regular y Charity fundraiser una tarjeta de cupones de GoodRx.  - Si necesita que su receta se enve electrnicamente a una farmacia diferente, informe a nuestra oficina a travs de MyChart de Why o por telfono llamando al [phone] y presione la opcin 4.

## 2021-12-12 NOTE — Progress Notes (Signed)
Follow-Up Visit   Subjective  Alex Burch is a 66 y.o. male who presents for the following: spot check (Patient reports that he has some spots at face he would like checked today. ). The patient presents for Total-Body Skin Exam (TBSE) for skin cancer screening and mole check.  The patient has spots, moles and lesions to be evaluated, some may be new or changing and the patient has concerns that these could be cancer.  Callus at left anterior latera sole foot recurrent thing several callus   The following portions of the chart were reviewed this encounter and updated as appropriate:  Tobacco  Allergies  Meds  Problems  Med Hx  Surg Hx  Fam Hx     Review of Systems: No other skin or systemic complaints except as noted in HPI or Assessment and Plan.  Objective  Well appearing patient in no apparent distress; mood and affect are within normal limits.  A full examination was performed including scalp, head, eyes, ears, nose, lips, neck, chest, axillae, abdomen, back, buttocks, bilateral upper extremities, bilateral lower extremities, hands, feet, fingers, toes, fingernails, and toenails. All findings within normal limits unless otherwise noted below.  face x 4 (4) Erythematous thin papules/macules with gritty scale.    Assessment & Plan  Actinic keratosis (4) face x 4 Start 5-fluorouracil/calcipotriene cream twice a day for 7 days to affected areas including face. Prescription sent to [facility]. Patient provided with contact information for pharmacy and advised the pharmacy will mail the prescription to their home. Patient provided with handout reviewing treatment course and side effects and advised to call or message [place] on MyChart with any concerns.  Actinic keratoses are precancerous spots that appear secondary to cumulative UV radiation exposure/sun exposure over time. They are chronic with expected duration over 1 year. A portion of actinic keratoses will  progress to squamous cell carcinoma of the skin. It is not possible to reliably predict which spots will progress to skin cancer and so treatment is recommended to prevent development of skin cancer.  Recommend daily broad spectrum sunscreen SPF 30+ to sun-exposed areas, reapply every 2 hours as needed.  Recommend staying in the shade or wearing long sleeves, sun glasses (UVA+UVB protection) and wide brim hats (4-inch brim around the entire circumference of the hat). Call for new or changing lesions.  Destruction of lesion - face x 4 Complexity: simple   Destruction method: cryotherapy   Informed consent: discussed and consent obtained   Timeout:  patient name, date of birth, surgical site, and procedure verified Lesion destroyed using liquid nitrogen: Yes   Region frozen until ice ball extended beyond lesion: Yes   Outcome: patient tolerated procedure well with no complications   Post-procedure details: wound care instructions given   Additional details:  Prior to procedure, discussed risks of blister formation, small wound, skin dyspigmentation, or rare scar following cryotherapy. Recommend Vaseline ointment to treated areas while healing.  fluorouracil (EFUDEX) 5 % cream - face x 4 Apply topically 2 (two) times daily. Apply to face for 7 days  Lentigines - Scattered tan macules - Due to sun exposure - Benign-appearing, observe - Recommend daily broad spectrum sunscreen SPF 30+ to sun-exposed areas, reapply every 2 hours as needed. - Call for any changes  Seborrheic Keratoses - Stuck-on, waxy, tan-brown papules and/or plaques  - Benign-appearing - Discussed benign etiology and prognosis. - Observe - Call for any changes  Melanocytic Nevi - Tan-brown and/or pink-flesh-colored symmetric macules and  papules - Benign appearing on exam today - Observation - Call clinic for new or changing moles - Recommend daily use of broad spectrum spf 30+ sunscreen to sun-exposed areas.    Varicose Veins/Spider Veins - Dilated blue, purple or red veins at the lower extremities - Reassured - Smaller vessels can be treated by sclerotherapy (a procedure to inject a medicine into the veins to make them disappear) if desired, but the treatment is not covered by insurance. Larger vessels may be covered if symptomatic and we would refer to vascular surgeon if treatment desired.  Hemangiomas - Red papules - Discussed benign nature - Observe - Call for any changes  Actinic Damage - Severe, confluent actinic changes with pre-cancerous actinic keratoses  - Severe, chronic, not at goal, secondary to cumulative UV radiation exposure over time - diffuse scaly erythematous macules and papules with underlying dyspigmentation - Discussed Prescription "Field Treatment" for Severe, Chronic Confluent Actinic Changes with Pre-Cancerous Actinic Keratoses Field treatment involves treatment of an entire area of skin that has confluent Actinic Changes (Sun/ Ultraviolet light damage) and PreCancerous Actinic Keratoses by method of PhotoDynamic Therapy (PDT) and/or prescription Topical Chemotherapy agents such as 5-fluorouracil, 5-fluorouracil/calcipotriene, and/or imiquimod.  The purpose is to decrease the number of clinically evident and subclinical PreCancerous lesions to prevent progression to development of skin cancer by chemically destroying early precancer changes that may or may not be visible.  It has been shown to reduce the risk of developing skin cancer in the treated area. As a result of treatment, redness, scaling, crusting, and open sores may occur during treatment course. One or more than one of these methods may be used and may have to be used several times to control, suppress and eliminate the PreCancerous changes. Discussed treatment course, expected reaction, and possible side effects. - Recommend daily broad spectrum sunscreen SPF 30+ to sun-exposed areas, reapply every 2 hours as  needed.  - Staying in the shade or wearing long sleeves, sun glasses (UVA+UVB protection) and wide brim hats (4-inch brim around the entire circumference of the hat) are also recommended. - Call for new or changing lesions.  Start 5-fluorouracil/calcipotriene cream twice a day for 7 days to affected areas including left cheek, left sideburn, left jaw. Prescription sent to [facility]. Patient provided with contact information for pharmacy and advised the pharmacy will mail the prescription to their home. Patient provided with handout reviewing treatment course and side effects and advised to call or message [place] on MyChart with any concerns.  Skin cancer screening performed today. Return in about 1 year (around 12/13/2022) for TBSE. IRuthell Rummage, CMA, am acting as scribe for Sarina Ser, MD. Documentation: I have reviewed the above documentation for accuracy and completeness, and I agree with the above.  Sarina Ser, MD

## 2021-12-21 ENCOUNTER — Encounter: Payer: Self-pay | Admitting: Dermatology

## 2022-04-24 ENCOUNTER — Ambulatory Visit (INDEPENDENT_AMBULATORY_CARE_PROVIDER_SITE_OTHER): Payer: Self-pay | Admitting: Dermatology

## 2022-04-24 DIAGNOSIS — I83893 Varicose veins of bilateral lower extremities with other complications: Secondary | ICD-10-CM

## 2022-04-24 NOTE — Patient Instructions (Signed)
Due to recent changes in healthcare laws, you may see results of your pathology and/or laboratory studies on MyChart before the doctors have had a chance to review them. We understand that in some cases there may be results that are confusing or concerning to you. Please understand that not all results are received at the same time and often the doctors may need to interpret multiple results in order to provide you with the best plan of care or course of treatment. Therefore, we ask that you please give us 2 business days to thoroughly review all your results before contacting the office for clarification. Should we see a critical lab result, you will be contacted sooner.   If You Need Anything After Your Visit  If you have any questions or concerns for your doctor, please call our main line at [phone] and press option 4 to reach your doctor's medical assistant. If no one answers, please leave a voicemail as directed and we will return your call as soon as possible. Messages left after 4 pm will be answered the following business day.   You may also send us a message via MyChart. We typically respond to MyChart messages within 1-2 business days.  For prescription refills, please ask your pharmacy to contact our office. Our fax number is [phone].  If you have an urgent issue when the clinic is closed that cannot wait until the next business day, you can page your doctor at the number below.    Please note that while we do our best to be available for urgent issues outside of office hours, we are not available 24/7.   If you have an urgent issue and are unable to reach us, you may choose to seek medical care at your doctor's office, retail clinic, urgent care center, or emergency room.  If you have a medical emergency, please immediately call 911 or go to the emergency department.  Pager Numbers  - Dr. Kowalski: [phone]  - Dr. Moye: [phone]  - Dr. Stewart:  [phone]  In the event of inclement weather, please call our main line at [phone] for an update on the status of any delays or closures.  Dermatology Medication Tips: Please keep the boxes that topical medications come in in order to help keep track of the instructions about where and how to use these. Pharmacies typically print the medication instructions only on the boxes and not directly on the medication tubes.   If your medication is too expensive, please contact our office at [phone] option 4 or send us a message through MyChart.   We are unable to tell what your co-pay for medications will be in advance as this is different depending on your insurance coverage. However, we may be able to find a substitute medication at lower cost or fill out paperwork to get insurance to cover a needed medication.   If a prior authorization is required to get your medication covered by your insurance company, please allow us 1-2 business days to complete this process.  Drug prices often vary depending on where the prescription is filled and some pharmacies may offer cheaper prices.  The website www.goodrx.com contains coupons for medications through different pharmacies. The prices here do not account for what the cost may be with help from insurance (it may be cheaper with your insurance), but the website can give you the price if you did not use any insurance.  - You can print the associated coupon and take it with   your prescription to the pharmacy.  - You may also stop by our office during regular business hours and pick up a GoodRx coupon card.  - If you need your prescription sent electronically to a different pharmacy, notify our office through [facility] MyChart or by phone at [phone] option 4.     Si Usted Necesita Algo Despus de Su Visita  Tambin puede enviarnos un mensaje a travs de MyChart. Por lo general respondemos a los mensajes de MyChart en el transcurso de 1 a 2  das hbiles.  Para renovar recetas, por favor pida a su farmacia que se ponga en contacto con nuestra oficina. Nuestro nmero de fax es el [phone].  Si tiene un asunto urgente cuando la clnica est cerrada y que no puede esperar hasta el siguiente da hbil, puede llamar/localizar a su doctor(a) al nmero que aparece a continuacin.   Por favor, tenga en cuenta que aunque hacemos todo lo posible para estar disponibles para asuntos urgentes fuera del horario de oficina, no estamos disponibles las 24 horas del da, los 7 das de la semana.   Si tiene un problema urgente y no puede comunicarse con nosotros, puede optar por buscar atencin mdica  en el consultorio de su doctor(a), en una clnica privada, en un centro de atencin urgente o en una sala de emergencias.  Si tiene una emergencia mdica, por favor llame inmediatamente al 911 o vaya a la sala de emergencias.  Nmeros de bper  - Dr. Kowalski: [phone]  - Dra. Moye: [phone]  - Dra. Stewart: [phone]  En caso de inclemencias del tiempo, por favor llame a nuestra lnea principal al [phone] para una actualizacin sobre el estado de cualquier retraso o cierre.  Consejos para la medicacin en dermatologa: Por favor, guarde las cajas en las que vienen los medicamentos de uso tpico para ayudarle a seguir las instrucciones sobre dnde y cmo usarlos. Las farmacias generalmente imprimen las instrucciones del medicamento slo en las cajas y no directamente en los tubos del medicamento.   Si su medicamento es muy caro, por favor, pngase en contacto con nuestra oficina llamando al [phone] y presione la opcin 4 o envenos un mensaje a travs de MyChart.   No podemos decirle cul ser su copago por los medicamentos por adelantado ya que esto es diferente dependiendo de la cobertura de su seguro. Sin embargo, es posible que podamos encontrar un medicamento sustituto a menor costo o llenar un formulario para que el  seguro cubra el medicamento que se considera necesario.   Si se requiere una autorizacin previa para que su compaa de seguros cubra su medicamento, por favor permtanos de 1 a 2 das hbiles para completar este proceso.  Los precios de los medicamentos varan con frecuencia dependiendo del lugar de dnde se surte la receta y alguna farmacias pueden ofrecer precios ms baratos.  El sitio web www.goodrx.com tiene cupones para medicamentos de diferentes farmacias. Los precios aqu no tienen en cuenta lo que podra costar con la ayuda del seguro (puede ser ms barato con su seguro), pero el sitio web puede darle el precio si no utiliz ningn seguro.  - Puede imprimir el cupn correspondiente y llevarlo con su receta a la farmacia.  - Tambin puede pasar por nuestra oficina durante el horario de atencin regular y recoger una tarjeta de cupones de GoodRx.  - Si necesita que su receta se enve electrnicamente a una farmacia diferente, informe a nuestra oficina a travs de MyChart de [facility]   o por telfono llamando al [phone] y presione la opcin 4.  

## 2022-04-24 NOTE — Progress Notes (Signed)
   Follow-Up Visit   Subjective  Alex Burch is a 66 y.o. male who presents for the following: Varicose Veins. Patient here for sclerotherapy on his legs today..   The following portions of the chart were reviewed this encounter and updated as appropriate:   Tobacco  Allergies  Meds  Problems  Med Hx  Surg Hx  Fam Hx     Review of Systems:  No other skin or systemic complaints except as noted in HPI or Assessment and Plan.  Objective  Well appearing patient in no apparent distress; mood and affect are within normal limits.  A focused examination was performed including bilateral legs. Relevant physical exam findings are noted in the Assessment and Plan.  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  Procedure: The patient was counseled and understands about the effects, side effects and potential risks and complications of the sclerotherapy procedure. The patient was given the opportunity to ask questions. Asclera (polidocanol) 1% (total 3cc) was injected into the varices. In order to ensure correct placement of the catheter in the vein, I drew back slightly to give moderate blood show in the syringe. If there was any evidence or suspicion of extravasation of sclerosant, the area was immediately diluted with a large volume of 0.9% saline. A pressure dressing was applied immediately to the injected sites. The patient tolerated the procedure well without complication. The patient was instructed in post-operative compression stocking use. The patient understands to call or return immediately if any problems noted.  Return in about 3  months (around 07/24/2022) for Scleotherapy or laser of veins .  IMarye Round, CMA, am acting as scribe for Sarina Ser, MD .  Documentation: I have reviewed the above documentation for accuracy and completeness, and I agree with the above.  Sarina Ser, MD

## 2022-05-08 ENCOUNTER — Encounter: Payer: Self-pay | Admitting: Dermatology

## 2022-06-04 ENCOUNTER — Ambulatory Visit: Payer: BC Managed Care – PPO | Admitting: Dermatology

## 2022-07-01 ENCOUNTER — Telehealth: Payer: Self-pay

## 2022-07-01 MED ORDER — MOMETASONE FUROATE 0.1 % EX OINT: TOPICAL_OINTMENT | Freq: Two times a day (BID) | CUTANEOUS | 0 refills | Status: DC

## 2022-07-01 NOTE — Telephone Encounter (Signed)
Patient advised of information per Dr. Kowalski and RX sent in. aw 

## 2022-07-01 NOTE — Telephone Encounter (Signed)
Patient came in this afternoon with concerns after using 5FU/Calcipotriene Cream. Patient was also here yesterday asking if reaction looked OKed. Abby advised patient reaction was normal but to not shave due to irritation. Patient has shaved face and complaining of burning skin. Patient is not red or any swelling but does have some dark spots from using treatment. Patient used medication for about five and half days, twice daily. Patient is asking for any recommendations of what could be used to help calm irritation.

## 2022-07-02 ENCOUNTER — Telehealth: Payer: Self-pay | Admitting: *Deleted

## 2022-07-02 ENCOUNTER — Other Ambulatory Visit: Payer: Self-pay | Admitting: *Deleted

## 2022-07-02 DIAGNOSIS — Z1211 Encounter for screening for malignant neoplasm of colon: Secondary | ICD-10-CM

## 2022-07-02 MED ORDER — NA SULFATE-K SULFATE-MG SULF 17.5-3.13-1.6 GM/177ML PO SOLN: 1.0000 | Freq: Once | ORAL | 0 refills | Status: AC

## 2022-07-02 NOTE — Telephone Encounter (Signed)
Gastroenterology Pre-Procedure Review  Request Date: 07/21/2022 Requesting Physician: Dr. Vicente Males  PATIENT REVIEW QUESTIONS: The patient responded to the following health history questions as indicated:    1. Are you having any GI issues? no 2. Do you have a personal history of Polyps? no 3. Do you have a family history of Colon Cancer or Polyps? no 4. Diabetes Mellitus? no 5. Joint replacements in the past 12 months?no 6. Major health problems in the past 3 months?no 7. Any artificial heart valves, MVP, or defibrillator?no    MEDICATIONS & ALLERGIES:    Patient reports the following regarding taking any anticoagulation/antiplatelet therapy:   Plavix, Coumadin, Eliquis, Xarelto, Lovenox, Pradaxa, Brilinta, or Effient? no Aspirin? no  Patient confirms/reports the following medications:  Current Outpatient Medications  Medication Sig Dispense Refill   Na Sulfate-K Sulfate-Mg Sulf 17.5-3.13-1.6 GM/177ML SOLN Take 1 kit by mouth once for 1 dose. 354 mL 0   acetaminophen-codeine (TYLENOL #3) 300-30 MG tablet Take 1-2 tablets by mouth every 4 (four) hours as needed for moderate pain. 20 tablet 0   ALPRAZolam (XANAX) 0.25 MG tablet Take 0.25 mg by mouth 2 (two) times daily as needed for anxiety.      docusate sodium (COLACE) 100 MG capsule Take 2 capsules (200 mg total) by mouth 2 (two) times daily. 120 capsule 3   fluorouracil (EFUDEX) 5 % cream Apply topically 2 (two) times daily. Apply to face for 7 days 15 g 1   FLUoxetine (PROZAC) 20 MG tablet Take 60 mg by mouth daily.      meloxicam (MOBIC) 15 MG tablet Take 1 tablet (15 mg total) by mouth daily. 30 tablet 3   mometasone (ELOCON) 0.1 % ointment Apply topically in the morning and at bedtime. For 1 week. D/c if rash improves 45 g 0   rosuvastatin (CRESTOR) 10 MG tablet Take 10 mg by mouth daily.      sulfamethoxazole-trimethoprim (BACTRIM DS) 800-160 MG tablet Take 1 tablet by mouth 2 (two) times daily. 14 tablet 0   No current  facility-administered medications for this visit.    Patient confirms/reports the following allergies:  Allergies  Allergen Reactions   Penicillins Hives    No orders of the defined types were placed in this encounter.   AUTHORIZATION INFORMATION Primary Insurance: 1D#: Group #:  Secondary Insurance: 1D#: Group #:  SCHEDULE INFORMATION: Date: 07/21/2022 Time: Location: [place]

## 2022-07-21 ENCOUNTER — Encounter
Admission: RE | Disposition: A | Discharge status: Home / Self Care | Payer: Self-pay | Source: Home / Self Care | Attending: Gastroenterology

## 2022-07-21 ENCOUNTER — Ambulatory Visit: Payer: Medicare Other | Admitting: Anesthesiology

## 2022-07-21 ENCOUNTER — Ambulatory Visit
Admission: RE | Admit: 2022-07-21 | Discharge: 2022-07-21 | Disposition: A | Discharge status: Home / Self Care | Payer: Medicare Other | Attending: Gastroenterology | Admitting: Gastroenterology

## 2022-07-21 DIAGNOSIS — Z1211 Encounter for screening for malignant neoplasm of colon: Secondary | ICD-10-CM | POA: Diagnosis present

## 2022-07-21 DIAGNOSIS — Z538 Procedure and treatment not carried out for other reasons: Secondary | ICD-10-CM | POA: Diagnosis not present

## 2022-07-21 SURGERY — COLONOSCOPY WITH PROPOFOL
Anesthesia: General

## 2022-07-21 MED ORDER — SODIUM CHLORIDE 0.9 % IV SOLN
INTRAVENOUS | Status: DC
  Administered 2022-07-21: 20 mL/h via INTRAVENOUS

## 2022-07-21 NOTE — Anesthesia Postprocedure Evaluation (Signed)
Anesthesia Post Note  Patient: Alex Burch  Procedure(s) Performed: COLONOSCOPY WITH PROPOFOL  Patient location during evaluation: PACU Anesthesia Type: General Level of consciousness: awake and alert, oriented and patient cooperative Pain management: pain level controlled Vital Signs Assessment: post-procedure vital signs reviewed and stable Respiratory status: spontaneous breathing, nonlabored ventilation and respiratory function stable Cardiovascular status: blood pressure returned to baseline and stable Postop Assessment: adequate PO intake Anesthetic complications: yes Comments: Case canceled in procedure room prior to anesthesia administration for atrial flutter.  Cardiology consulted, and I personally discussed the case with Dr. Fletcher Anon.  He will have the patient seen as an outpatient.  Discussed with patient.   Encounter Notable Events  Notable Event Outcome Phase Comment  Case cancelled in OR Unresolved Intraprocedure Case canceled for new-onset atrial flutter     Last Vitals:  Vitals:   07/21/22 1044 07/21/22 1114  BP: (!) 153/89 (!) 149/87  Pulse: 68 62  Resp: 11 17  Temp:    SpO2: 100% 100%    Last Pain:  Vitals:   07/21/22 1114  TempSrc:   PainSc: 0-No pain                 Darrin Nipper

## 2022-07-21 NOTE — H&P (Signed)
Jonathon Bellows, MD [address], [place], [place], [place], [address] [address], [place], [place], [place], [address] Phone: [phone]  Fax: [phone]  Primary Care Physician:  Albina Billet, MD   Pre-Procedure History & Physical: HPI:  Alex Burch is a 67 y.o. male is here for an colonoscopy.   Past Medical History:  Diagnosis Date   Actinic keratosis    Anxiety    Degenerative joint disease    Hypercholesterolemia    Plantar fasciitis    Right inguinal hernia    Tendinitis of right hip     Past Surgical History:  Procedure Laterality Date   COLONOSCOPY     INGUINAL HERNIA REPAIR Right 05/10/2020   Procedure: HERNIA REPAIR INGUINAL ADULT;  Surgeon: Royston Cowper, MD;  Location: [facility] ORS;  Service: Urology;  Laterality: Right;   NASAL SEPTUM SURGERY     TONSILLECTOMY      Prior to Admission medications   Medication Sig Start Date End Date Taking? Authorizing Provider  acetaminophen-codeine (TYLENOL #3) 300-30 MG tablet Take 1-2 tablets by mouth every 4 (four) hours as needed for moderate pain. 05/10/20  Yes Royston Cowper, MD  ALPRAZolam Duanne Moron) 0.25 MG tablet Take 0.25 mg by mouth 2 (two) times daily as needed for anxiety.    Yes [provider]  docusate sodium (COLACE) 100 MG capsule Take 2 capsules (200 mg total) by mouth 2 (two) times daily. 05/10/20  Yes Royston Cowper, MD  fluorouracil (EFUDEX) 5 % cream Apply topically 2 (two) times daily. Apply to face for 7 days 12/12/21  Yes Ralene Bathe, MD  FLUoxetine (PROZAC) 20 MG tablet Take 60 mg by mouth daily.    Yes [provider]  meloxicam (MOBIC) 15 MG tablet Take 1 tablet (15 mg total) by mouth daily. 04/14/14  Yes Hyatt, Max T, DPM  mometasone (ELOCON) 0.1 % ointment Apply topically in the morning and at bedtime. For 1 week. D/c if rash improves 07/01/22  Yes Ralene Bathe, MD  rosuvastatin (CRESTOR) 10 MG tablet Take 10 mg by mouth daily.  11/04/18  Yes  [provider]  sulfamethoxazole-trimethoprim (BACTRIM DS) 800-160 MG tablet Take 1 tablet by mouth 2 (two) times daily. 05/10/20  Yes Royston Cowper, MD    Allergies as of 07/02/2022 - Review Complete 05/08/2022  Allergen Reaction Noted   Penicillins Hives 06/06/2015    No family history on file.  Social History   Socioeconomic History   Marital status: Married    Spouse name: Not on file   Number of children: Not on file   Years of education: Not on file   Highest education level: Not on file  Occupational History   Not on file  Tobacco Use   Smoking status: Never   Smokeless tobacco: Never  Substance and Sexual Activity   Alcohol use: Not on file   Drug use: Not on file   Sexual activity: Not on file  Other Topics Concern   Not on file  Social History Narrative   Not on file   Social Determinants of Health   Financial Resource Strain: Not on file  Food Insecurity: Not on file  Transportation Needs: Not on file  Physical Activity: Not on file  Stress: Not on file  Social Connections: Not on file  Intimate Partner Violence: Not on file    Review of Systems: See HPI, otherwise negative ROS  Physical Exam: BP 119/87  Pulse 85   Temp (!) 97 F (36.1 C) (Temporal)   Resp 20   Ht 6' (1.829 m)   Wt 79.2 kg   SpO2 98%   BMI 23.68 kg/m  General:   Alert,  pleasant and cooperative in NAD Head:  Normocephalic and atraumatic. Neck:  Supple; no masses or thyromegaly. Lungs:  Clear throughout to auscultation, normal respiratory effort.    Heart:  +S1, +S2, Regular rate and rhythm, No edema. Abdomen:  Soft, nontender and nondistended. Normal bowel sounds, without guarding, and without rebound.   Neurologic:  Alert and  oriented x4;  grossly normal neurologically.  Impression/Plan: Alex Burch is here for an colonoscopy to be performed for Screening colonoscopy average risk   Risks, benefits, limitations, and alternatives regarding   colonoscopy have been reviewed with the patient.  Questions have been answered.  All parties agreeable.   Jonathon Bellows, MD  07/21/2022, 10:27 AM

## 2022-07-21 NOTE — Anesthesia Preprocedure Evaluation (Addendum)
Anesthesia Evaluation  Patient identified by MRN, date of birth, ID band Patient awake    Reviewed: Allergy & Precautions, NPO status , Patient's Chart, lab work & pertinent test results  History of Anesthesia Complications Negative for: history of anesthetic complications  Airway Mallampati: IV   Neck ROM: Full    Dental  (+) Implants   Pulmonary neg pulmonary ROS   Pulmonary exam normal breath sounds clear to auscultation       Cardiovascular Exercise Tolerance: Good negative cardio ROS Normal cardiovascular exam Rhythm:Regular Rate:Normal     Neuro/Psych  PSYCHIATRIC DISORDERS Anxiety     negative neurological ROS     GI/Hepatic negative GI ROS,,,  Endo/Other  negative endocrine ROS    Renal/GU negative Renal ROS     Musculoskeletal   Abdominal   Peds  Hematology negative hematology ROS (+)   Anesthesia Other Findings   Reproductive/Obstetrics                             Anesthesia Physical Anesthesia Plan  ASA: 2  Anesthesia Plan: General   Post-op Pain Management:    Induction: Intravenous  PONV Risk Score and Plan: 2 and Propofol infusion, TIVA and Treatment may vary due to age or medical condition  Airway Management Planned: Natural Airway  Additional Equipment:   Intra-op Plan:   Post-operative Plan:   Informed Consent: I have reviewed the patients History and Physical, chart, labs and discussed the procedure including the risks, benefits and alternatives for the proposed anesthesia with the patient or authorized representative who has indicated his/her understanding and acceptance.       Plan Discussed with: CRNA  Anesthesia Plan Comments: (LMA/GETA backup discussed.  Patient consented for risks of anesthesia including but not limited to:  - adverse reactions to medications - damage to eyes, teeth, lips or other oral mucosa - nerve damage due to  positioning  - sore throat or hoarseness - damage to heart, brain, nerves, lungs, other parts of body or loss of life  Informed patient about role of CRNA in peri- and intra-operative care.  Patient voiced understanding.)       Anesthesia Quick Evaluation

## 2022-07-22 ENCOUNTER — Encounter: Payer: Self-pay | Admitting: Cardiovascular Disease

## 2022-07-22 ENCOUNTER — Ambulatory Visit: Payer: Medicare Other | Attending: Cardiovascular Disease | Admitting: Cardiovascular Disease

## 2022-07-22 ENCOUNTER — Telehealth: Payer: Self-pay | Admitting: Cardiovascular Disease

## 2022-07-22 VITALS — BP 150/70 | HR 82 | Ht 72.0 in | Wt 175.2 lb

## 2022-07-22 DIAGNOSIS — I4892 Unspecified atrial flutter: Secondary | ICD-10-CM | POA: Diagnosis present

## 2022-07-22 DIAGNOSIS — E782 Mixed hyperlipidemia: Secondary | ICD-10-CM | POA: Diagnosis present

## 2022-07-22 MED ORDER — APIXABAN 5 MG PO TABS: 5.0000 mg | ORAL_TABLET | Freq: Two times a day (BID) | ORAL | 1 refills | Status: DC

## 2022-07-22 MED ORDER — METOPROLOL SUCCINATE ER 25 MG PO TB24: 25.0000 mg | ORAL_TABLET | Freq: Every day | ORAL | 1 refills | Status: DC

## 2022-07-22 NOTE — Progress Notes (Signed)
Cardiology Office Note  Date:  07/22/2022   ID:  Alex Burch, DOB 1956-03-15, MRN [id number]  PCP:  Albina Billet, MD   Chief Complaint  Patient presents with   New Patient (Initial Visit)    Patient was at pre-op for a colonoscopy and was found to have A-flutter. Patient had COVID 3 weeks ago. Medications reviewed by the patient verbally.     HPI:  Alex Burch is a 67 year old gentleman with past medical history of COVID 3 weeks ago Who presented for colonoscopy found to have atrial flutter  At baseline reports that he is very Active, goes to gym, does very hard exercise routine, sweats profusely  Recent events including Covid end of 2023, he did notice SOB at the gym, seems to be better now  Recent preparation for colonoscopy went yesterday was told that his heart was out of rhythm EKG confirming atrial flutter Feels well, possibly little bit jittery or anxious.  Unclear if this is the rhythm or just the knowledge of his heart being out of rhythm making him feel anxious  Denies significant lower extremity edema, no abdominal bloating No near-syncope or syncope  EKG personally reviewed by myself on todays visit Atrial flutter ventricular rate 82 bpm   PMH:   has a past medical history of Actinic keratosis, Anxiety, Degenerative joint disease, Hypercholesterolemia, Plantar fasciitis, Right inguinal hernia, and Tendinitis of right hip.  PSH:    Past Surgical History:  Procedure Laterality Date   COLONOSCOPY     INGUINAL HERNIA REPAIR Right 05/10/2020   Procedure: HERNIA REPAIR INGUINAL ADULT;  Surgeon: Royston Cowper, MD;  Location: [facility] ORS;  Service: Urology;  Laterality: Right;   NASAL SEPTUM SURGERY     TONSILLECTOMY      Current Outpatient Medications  Medication Sig Dispense Refill   ALPRAZolam (XANAX) 0.25 MG tablet Take 0.25 mg by mouth 2 (two) times daily as needed for anxiety.      apixaban (ELIQUIS) 5 MG TABS tablet Take 1 tablet (5 mg  total) by mouth 2 (two) times daily. 60 tablet 1   docusate sodium (COLACE) 100 MG capsule Take 2 capsules (200 mg total) by mouth 2 (two) times daily. 120 capsule 3   FLUoxetine (PROZAC) 20 MG tablet Take 60 mg by mouth daily.      meloxicam (MOBIC) 15 MG tablet Take 1 tablet (15 mg total) by mouth daily. 30 tablet 3   metoprolol succinate (TOPROL XL) 25 MG 24 hr tablet Take 1 tablet (25 mg total) by mouth daily. 30 tablet 1   rosuvastatin (CRESTOR) 10 MG tablet Take 10 mg by mouth daily.      sulfamethoxazole-trimethoprim (BACTRIM DS) 800-160 MG tablet Take 1 tablet by mouth 2 (two) times daily. 14 tablet 0   acetaminophen-codeine (TYLENOL #3) 300-30 MG tablet Take 1-2 tablets by mouth every 4 (four) hours as needed for moderate pain. (Patient not taking: Reported on 07/22/2022) 20 tablet 0   fluorouracil (EFUDEX) 5 % cream Apply topically 2 (two) times daily. Apply to face for 7 days (Patient not taking: Reported on 07/22/2022) 15 g 1   mometasone (ELOCON) 0.1 % ointment Apply topically in the morning and at bedtime. For 1 week. D/c if rash improves (Patient not taking: Reported on 07/22/2022) 45 g 0   No current facility-administered medications for this visit.     Allergies:   Penicillins   Social History:  The patient  reports that he has never smoked. He  has never used smokeless tobacco. He reports current alcohol use. He reports that he does not use drugs.   Family History:   family history includes Arrhythmia in his brother; Atrial fibrillation in his brother; Heart disease in his father; Heart failure in his father; Hyperlipidemia in his father; Hypertension in his father.    Review of Systems: Review of Systems  Constitutional: Negative.   HENT: Negative.    Respiratory: Negative.    Cardiovascular: Negative.   Gastrointestinal: Negative.   Musculoskeletal: Negative.   Neurological: Negative.   Psychiatric/Behavioral: Negative.    All other systems reviewed and are  negative.    PHYSICAL EXAM: VS:  BP (!) 150/70 (BP Location: Right Arm, Patient Position: Sitting, Cuff Size: Normal)   Pulse 82   Ht 6' (1.829 m)   Wt 175 lb 4 oz (79.5 kg)   SpO2 98%   BMI 23.77 kg/m  , BMI Body mass index is 23.77 kg/m. GEN: Well nourished, well developed, in no acute distress HEENT: normal Neck: no JVD, carotid bruits, or masses Cardiac: Irregularly irregular no murmurs, rubs, or gallops,no edema  Respiratory:  clear to auscultation bilaterally, normal work of breathing GI: soft, nontender, nondistended, + BS MS: no deformity or atrophy Skin: warm and dry, no rash Neuro:  Strength and sensation are intact Psych: euthymic mood, full affect    Recent Labs: No results found for requested labs within last 365 days.    Lipid Panel No results found for: "CHOL", "HDL", "LDLCALC", "TRIG"    Wt Readings from Last 3 Encounters:  07/22/22 175 lb 4 oz (79.5 kg)  07/21/22 174 lb 9.6 oz (79.2 kg)  05/10/20 180 lb (81.6 kg)       ASSESSMENT AND PLAN:  Problem List Items Addressed This Visit   None Visit Diagnoses     Atrial flutter, unspecified type ([place])    -  Primary   Relevant Medications   metoprolol succinate (TOPROL XL) 25 MG 24 hr tablet   apixaban (ELIQUIS) 5 MG TABS tablet   Other Relevant Orders   EKG 12-Lead   ECHOCARDIOGRAM COMPLETE   Mixed hyperlipidemia       Relevant Medications   metoprolol succinate (TOPROL XL) 25 MG 24 hr tablet   apixaban (ELIQUIS) 5 MG TABS tablet      Atrial flutter New onset, duration of arrhythmia unclear No recent EKGs apart from 2005 May have mild shortness of breath, anxious feeling but unclear symptoms, may be unrelated We have recommended echocardiogram to workup structural heart disease Will start Eliquis 5 twice daily Started metoprolol succinate 25 for rate control Plan to meet up again after echocardiogram, approximately 4 weeks, could consider cardioversion at that time No restrictions, he  can continue exercise as tolerated  Hyperlipidemia On Crestor 10 mg daily   Total encounter time more than 60 minutes  Greater than 50% was spent in counseling and coordination of care with the patient    Signed, Esmond Plants, M.D., Ph.D. [place], [place]

## 2022-07-22 NOTE — Telephone Encounter (Signed)
LVM to schedule new patient appointment within the next two weeks per Dr Rockey Situ.

## 2022-07-22 NOTE — Patient Instructions (Addendum)
Medication Instructions:  Please start eliquis 5 mg twice a day Please start metoprolol succinate 25 mg daily  If you need a refill on your cardiac medications before your next appointment, please call your pharmacy.   Lab work: No new labs needed  Testing/Procedures: Echo for atrial flutter Your physician has requested that you have an echocardiogram. Echocardiography is a painless test that uses sound waves to create images of your heart. It provides your doctor with information about the size and shape of your heart and how well your heart's chambers and valves are working. This procedure takes approximately one hour. There are no restrictions for this procedure. Please do NOT wear cologne, perfume, aftershave, or lotions (deodorant is allowed). Please arrive 15 minutes prior to your appointment time.   Follow-Up: At [facility], you and your health needs are our priority.  As part of our continuing mission to provide you with exceptional heart care, we have created designated Provider Care Teams.  These Care Teams include your primary Cardiologist (physician) and Advanced Practice Providers (APPs -  Physician Assistants and Nurse Practitioners) who all work together to provide you with the care you need, when you need it.  You will need a follow up appointment in 1 month after echo  Providers on your designated Care Team:   Murray Hodgkins, NP Christell Faith, PA-C Cadence Kathlen Mody, [place]  COVID-19 Vaccine Information can be found at: [email or web address].uk For questions related to vaccine distribution or appointments, please email [email or web address] or call [phone].

## 2022-08-11 ENCOUNTER — Telehealth: Payer: Self-pay

## 2022-08-11 ENCOUNTER — Ambulatory Visit: Payer: Medicare Other | Attending: Cardiovascular Disease

## 2022-08-11 DIAGNOSIS — R55 Syncope and collapse: Secondary | ICD-10-CM

## 2022-08-11 NOTE — Telephone Encounter (Signed)
-----   Message from Minna Merritts, MD sent at 08/10/2022  6:29 PM EDT ----- Can we send a zio monitor home address Had episode of syncope on Sunday 08/10/22 Thanks Esmond Plants

## 2022-08-11 NOTE — Telephone Encounter (Signed)
Monitor order placed as recommended by MD.

## 2022-08-18 ENCOUNTER — Telehealth: Payer: Self-pay | Admitting: Cardiovascular Disease

## 2022-08-18 NOTE — Telephone Encounter (Signed)
Patient came into office with question about monitor that was installed last week. It came loose & wants to be sure it still works, please assist.

## 2022-08-18 NOTE — Telephone Encounter (Signed)
Returned call to pt He stated the monitor adhesive on the left side slightly came off and wanted to make sure monitor was ok. Pt stated no lights are flashing   Nurse informed pt as long as his doesn't see lights, his monitor is still recording.  Pt verbalized understanding.

## 2022-08-19 ENCOUNTER — Other Ambulatory Visit: Payer: Self-pay | Admitting: Cardiovascular Disease

## 2022-09-02 ENCOUNTER — Ambulatory Visit: Payer: Medicare Other | Attending: Cardiovascular Disease

## 2022-09-02 DIAGNOSIS — I4892 Unspecified atrial flutter: Secondary | ICD-10-CM | POA: Diagnosis present

## 2022-09-02 LAB — ECHOCARDIOGRAM COMPLETE
AR max vel: 3.52 cm2
AV Area VTI: 3.18 cm2
AV Area mean vel: 3.15 cm2
AV Mean grad: 3 mmHg
AV Peak grad: 6.3 mmHg
AV Vena cont: 0.73 cm
Ao pk vel: 1.25 m/s
Area-P 1/2: 4.24 cm2
P 1/2 time: 740 msec
S' Lateral: 3.6 cm

## 2022-09-03 ENCOUNTER — Encounter: Payer: Self-pay | Admitting: Cardiovascular Disease

## 2022-09-04 ENCOUNTER — Encounter: Payer: Self-pay | Admitting: Cardiovascular Disease

## 2022-09-04 ENCOUNTER — Ambulatory Visit (INDEPENDENT_AMBULATORY_CARE_PROVIDER_SITE_OTHER): Payer: Self-pay | Admitting: Dermatology

## 2022-09-04 VITALS — BP 152/82 | HR 79

## 2022-09-04 DIAGNOSIS — I781 Nevus, non-neoplastic: Secondary | ICD-10-CM

## 2022-09-04 DIAGNOSIS — I4892 Unspecified atrial flutter: Secondary | ICD-10-CM

## 2022-09-04 NOTE — Progress Notes (Signed)
   Follow-Up Visit   Subjective  Alex Burch is a 67 y.o. male who presents for the following: 4 months f/u patient here for sclerotherapy on his legs today    The following portions of the chart were reviewed this encounter and updated as appropriate: medications, allergies, medical history  Review of Systems:  No other skin or systemic complaints except as noted in HPI or Assessment and Plan.  Objective  Well appearing patient in no apparent distress; mood and affect are within normal limits.  A focused examination was performed of the following areas: bilateral legs    Relevant exam findings are noted in the Assessment and Plan.                 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  Procedure: The patient was counseled and understands about the effects, side effects and potential risks and complications of the sclerotherapy procedure. The patient was given the opportunity to ask questions. Asclera (polidocanol) 1% (total 2cc) was injected into the varices. In order to ensure correct placement of the catheter in the vein, I drew back slightly to give moderate blood show in the syringe. If there was any evidence or suspicion of extravasation of sclerosant, the area was immediately diluted with a large volume of 0.9% saline. A pressure dressing was applied immediately to the injected sites. The patient tolerated the procedure well without complication. The patient was instructed in post-operative compression stocking use. The patient understands to call or return immediately if any problems noted.    Return if symptoms worsen or fail to improve.  IAngelique Holm, CMA, am acting as scribe for Armida Sans, MD .    Documentation: I have reviewed the above documentation for accuracy and completeness, and I agree with the above.  Armida Sans, MD

## 2022-09-04 NOTE — Patient Instructions (Signed)
Due to recent changes in healthcare laws, you may see results of your pathology and/or laboratory studies on MyChart before the doctors have had a chance to review them. We understand that in some cases there may be results that are confusing or concerning to you. Please understand that not all results are received at the same time and often the doctors may need to interpret multiple results in order to provide you with the best plan of care or course of treatment. Therefore, we ask that you please give us 2 business days to thoroughly review all your results before contacting the office for clarification. Should we see a critical lab result, you will be contacted sooner.   If You Need Anything After Your Visit  If you have any questions or concerns for your doctor, please call our main line at [phone] and press option 4 to reach your doctor's medical assistant. If no one answers, please leave a voicemail as directed and we will return your call as soon as possible. Messages left after 4 pm will be answered the following business day.   You may also send us a message via MyChart. We typically respond to MyChart messages within 1-2 business days.  For prescription refills, please ask your pharmacy to contact our office. Our fax number is [phone].  If you have an urgent issue when the clinic is closed that cannot wait until the next business day, you can page your doctor at the number below.    Please note that while we do our best to be available for urgent issues outside of office hours, we are not available 24/7.   If you have an urgent issue and are unable to reach us, you may choose to seek medical care at your doctor's office, retail clinic, urgent care center, or emergency room.  If you have a medical emergency, please immediately call 911 or go to the emergency department.  Pager Numbers  - Dr. Kowalski: [phone]  - Dr. Moye: [phone]  - Dr. Stewart:  [phone]  In the event of inclement weather, please call our main line at [phone] for an update on the status of any delays or closures.  Dermatology Medication Tips: Please keep the boxes that topical medications come in in order to help keep track of the instructions about where and how to use these. Pharmacies typically print the medication instructions only on the boxes and not directly on the medication tubes.   If your medication is too expensive, please contact our office at [phone] option 4 or send us a message through MyChart.   We are unable to tell what your co-pay for medications will be in advance as this is different depending on your insurance coverage. However, we may be able to find a substitute medication at lower cost or fill out paperwork to get insurance to cover a needed medication.   If a prior authorization is required to get your medication covered by your insurance company, please allow us 1-2 business days to complete this process.  Drug prices often vary depending on where the prescription is filled and some pharmacies may offer cheaper prices.  The website www.goodrx.com contains coupons for medications through different pharmacies. The prices here do not account for what the cost may be with help from insurance (it may be cheaper with your insurance), but the website can give you the price if you did not use any insurance.  - You can print the associated coupon and take it with   your prescription to the pharmacy.  - You may also stop by our office during regular business hours and pick up a GoodRx coupon card.  - If you need your prescription sent electronically to a different pharmacy, notify our office through [facility] MyChart or by phone at [phone] option 4.     Si Usted Necesita Algo Despus de Su Visita  Tambin puede enviarnos un mensaje a travs de MyChart. Por lo general respondemos a los mensajes de MyChart en el transcurso de 1 a 2  das hbiles.  Para renovar recetas, por favor pida a su farmacia que se ponga en contacto con nuestra oficina. Nuestro nmero de fax es el [phone].  Si tiene un asunto urgente cuando la clnica est cerrada y que no puede esperar hasta el siguiente da hbil, puede llamar/localizar a su doctor(a) al nmero que aparece a continuacin.   Por favor, tenga en cuenta que aunque hacemos todo lo posible para estar disponibles para asuntos urgentes fuera del horario de oficina, no estamos disponibles las 24 horas del da, los 7 das de la semana.   Si tiene un problema urgente y no puede comunicarse con nosotros, puede optar por buscar atencin mdica  en el consultorio de su doctor(a), en una clnica privada, en un centro de atencin urgente o en una sala de emergencias.  Si tiene una emergencia mdica, por favor llame inmediatamente al 911 o vaya a la sala de emergencias.  Nmeros de bper  - Dr. Kowalski: [phone]  - Dra. Moye: [phone]  - Dra. Stewart: [phone]  En caso de inclemencias del tiempo, por favor llame a nuestra lnea principal al [phone] para una actualizacin sobre el estado de cualquier retraso o cierre.  Consejos para la medicacin en dermatologa: Por favor, guarde las cajas en las que vienen los medicamentos de uso tpico para ayudarle a seguir las instrucciones sobre dnde y cmo usarlos. Las farmacias generalmente imprimen las instrucciones del medicamento slo en las cajas y no directamente en los tubos del medicamento.   Si su medicamento es muy caro, por favor, pngase en contacto con nuestra oficina llamando al [phone] y presione la opcin 4 o envenos un mensaje a travs de MyChart.   No podemos decirle cul ser su copago por los medicamentos por adelantado ya que esto es diferente dependiendo de la cobertura de su seguro. Sin embargo, es posible que podamos encontrar un medicamento sustituto a menor costo o llenar un formulario para que el  seguro cubra el medicamento que se considera necesario.   Si se requiere una autorizacin previa para que su compaa de seguros cubra su medicamento, por favor permtanos de 1 a 2 das hbiles para completar este proceso.  Los precios de los medicamentos varan con frecuencia dependiendo del lugar de dnde se surte la receta y alguna farmacias pueden ofrecer precios ms baratos.  El sitio web www.goodrx.com tiene cupones para medicamentos de diferentes farmacias. Los precios aqu no tienen en cuenta lo que podra costar con la ayuda del seguro (puede ser ms barato con su seguro), pero el sitio web puede darle el precio si no utiliz ningn seguro.  - Puede imprimir el cupn correspondiente y llevarlo con su receta a la farmacia.  - Tambin puede pasar por nuestra oficina durante el horario de atencin regular y recoger una tarjeta de cupones de GoodRx.  - Si necesita que su receta se enve electrnicamente a una farmacia diferente, informe a nuestra oficina a travs de MyChart de [place]   o por telfono llamando al [phone] y presione la opcin 4.  

## 2022-09-05 ENCOUNTER — Telehealth: Payer: Self-pay | Admitting: Cardiovascular Disease

## 2022-09-05 NOTE — Telephone Encounter (Signed)
[employer] calling with critical results for the patient

## 2022-09-08 ENCOUNTER — Telehealth: Payer: Self-pay | Admitting: Cardiovascular Disease

## 2022-09-08 NOTE — Telephone Encounter (Signed)
Patient states he is returning call. Please advise.

## 2022-09-08 NOTE — Telephone Encounter (Signed)
Returned call, triage had called patient about long term monitor results earlier today. No answer, left message per DPR asking patient to call back.

## 2022-09-09 ENCOUNTER — Other Ambulatory Visit
Admission: RE | Admit: 2022-09-09 | Discharge: 2022-09-09 | Disposition: A | Discharge status: Home / Self Care | Payer: Medicare Other | Source: Ambulatory Visit | Attending: Cardiovascular Disease | Admitting: Cardiovascular Disease

## 2022-09-09 DIAGNOSIS — I4892 Unspecified atrial flutter: Secondary | ICD-10-CM | POA: Diagnosis present

## 2022-09-09 LAB — CBC
HCT: 43.4 % (ref 39.0–52.0)
Hemoglobin: 14.4 g/dL (ref 13.0–17.0)
MCH: 29.5 pg (ref 26.0–34.0)
MCHC: 33.2 g/dL (ref 30.0–36.0)
MCV: 88.9 fL (ref 80.0–100.0)
Platelets: 206 10*3/uL (ref 150–400)
RBC: 4.88 MIL/uL (ref 4.22–5.81)
RDW: 13.2 % (ref 11.5–15.5)
WBC: 7.9 10*3/uL (ref 4.0–10.5)
nRBC: 0 % (ref 0.0–0.2)

## 2022-09-09 LAB — BASIC METABOLIC PANEL
Anion gap: 10 (ref 5–15)
BUN: 16 mg/dL (ref 8–23)
CO2: 28 mmol/L (ref 22–32)
Calcium: 9.8 mg/dL (ref 8.9–10.3)
Chloride: 102 mmol/L (ref 98–111)
Creatinine, Ser: 1.02 mg/dL (ref 0.61–1.24)
GFR, Estimated: >60 mL/min (ref >60)
Glucose, Bld: 101 mg/dL — ABNORMAL HIGH (ref 70–99)
Potassium: 3.9 mmol/L (ref 3.5–5.1)
Sodium: 140 mmol/L (ref 135–145)

## 2022-09-09 NOTE — Telephone Encounter (Signed)
Message sent via Claiborne Billings, Tollie Pizza, MD  Cv Div [place] Triage3 minutes ago (9:07 AM)    Can we find out when his cruise trip is scheduled Perhaps we can try to arrange cardioversion before he leaves, even later this week He might need preprocedure lab work Thx TG

## 2022-09-09 NOTE — Telephone Encounter (Signed)
Reviewed the patient's chart. Per a MyChart message from today, he is scheduled for a DCCV on 09/11/22 with Dr. Mariah Milling.

## 2022-09-10 ENCOUNTER — Other Ambulatory Visit: Payer: Self-pay | Admitting: Cardiovascular Disease

## 2022-09-10 DIAGNOSIS — I483 Typical atrial flutter: Secondary | ICD-10-CM

## 2022-09-11 ENCOUNTER — Encounter
Admission: RE | Disposition: A | Discharge status: Home / Self Care | Payer: Self-pay | Source: Home / Self Care | Attending: Cardiovascular Disease

## 2022-09-11 ENCOUNTER — Encounter: Payer: Self-pay | Admitting: Cardiovascular Disease

## 2022-09-11 ENCOUNTER — Ambulatory Visit: Payer: Medicare Other | Admitting: Anesthesiology

## 2022-09-11 ENCOUNTER — Ambulatory Visit
Admission: RE | Admit: 2022-09-11 | Discharge: 2022-09-11 | Disposition: A | Discharge status: Home / Self Care | Payer: Medicare Other | Attending: Cardiovascular Disease | Admitting: Cardiovascular Disease

## 2022-09-11 DIAGNOSIS — Z8249 Family history of ischemic heart disease and other diseases of the circulatory system: Secondary | ICD-10-CM | POA: Diagnosis not present

## 2022-09-11 DIAGNOSIS — I483 Typical atrial flutter: Secondary | ICD-10-CM | POA: Diagnosis not present

## 2022-09-11 DIAGNOSIS — Z7901 Long term (current) use of anticoagulants: Secondary | ICD-10-CM | POA: Insufficient documentation

## 2022-09-11 DIAGNOSIS — Z8616 Personal history of COVID-19: Secondary | ICD-10-CM | POA: Insufficient documentation

## 2022-09-11 DIAGNOSIS — E782 Mixed hyperlipidemia: Secondary | ICD-10-CM | POA: Insufficient documentation

## 2022-09-11 DIAGNOSIS — I4891 Unspecified atrial fibrillation: Secondary | ICD-10-CM | POA: Diagnosis not present

## 2022-09-11 DIAGNOSIS — R0602 Shortness of breath: Secondary | ICD-10-CM

## 2022-09-11 HISTORY — DX: Cardiac arrhythmia, unspecified: I49.9

## 2022-09-11 HISTORY — PX: CARDIOVERSION: SHX1299

## 2022-09-11 SURGERY — CARDIOVERSION
Anesthesia: General

## 2022-09-11 MED ORDER — PROPOFOL 10 MG/ML IV BOLUS
INTRAVENOUS | Status: DC | PRN
  Administered 2022-09-11: 30 mg via INTRAVENOUS
  Administered 2022-09-11: 50 mg via INTRAVENOUS
  Administered 2022-09-11: 20 mg via INTRAVENOUS

## 2022-09-11 MED ORDER — SODIUM CHLORIDE 0.9 % IV SOLN: INTRAVENOUS | Status: DC

## 2022-09-11 NOTE — Anesthesia Procedure Notes (Signed)
Procedure Name: MAC Date/Time: 09/11/2022 7:54 AM  Performed by: Elmarie Mainland, CRNAPre-anesthesia Checklist: Patient identified, Emergency Drugs available, Suction available and Patient being monitored Patient Re-evaluated:Patient Re-evaluated prior to induction Oxygen Delivery Method: Nasal cannula

## 2022-09-11 NOTE — Transfer of Care (Signed)
Immediate Anesthesia Transfer of Care Note  Patient: Alex Burch  Procedure(s) Performed: CARDIOVERSION  Patient Location: PACU and special recoveries  Anesthesia Type:General  Level of Consciousness: awake, drowsy, and patient cooperative  Airway & Oxygen Therapy: Patient Spontanous Breathing and Patient connected to nasal cannula oxygen  Post-op Assessment: Report given to RN and Post -op Vital signs reviewed and stable  Post vital signs: Reviewed and stable  Last Vitals:  Vitals Value Taken Time  BP 103/67 09/11/22 0803  Temp    Pulse 43 09/11/22 0804  Resp 18 09/11/22 0804  SpO2 100 % 09/11/22 0804    Last Pain:  Vitals:   09/11/22 0726  TempSrc: Oral  PainSc: 0-No pain         Complications: No notable events documented.

## 2022-09-11 NOTE — CV Procedure (Signed)
Cardioversion procedure note °For atrial flutter, typical ° °Procedure Details: ° °Consent: Risks of procedure as well as the alternatives and risks of each were explained to the (patient/caregiver).  Consent for procedure obtained. ° °Time Out: Verified patient identification, verified procedure, site/side was marked, verified correct patient position, special equipment/implants available, medications/allergies/relevent history reviewed, required imaging and test results available.  Performed ° °Patient placed on cardiac monitor, pulse oximetry, supplemental oxygen as necessary.   °Sedation given: propofol IV, Dr. Johnson °Pacer pads placed anterior and posterior chest. ° ° °Cardioverted 1 time(s).   °Cardioverted at 150 J. Synchronized biphasic °Converted to NSR ° ° °Evaluation: °Findings: Post procedure EKG shows: NSR °Complications: None °Patient did tolerate procedure well. ° °Time Spent Directly with the Patient: ° °45 minutes  ° °Tim Glyn Zendejas, M.D., Ph.D. ° °

## 2022-09-11 NOTE — Anesthesia Preprocedure Evaluation (Signed)
Anesthesia Evaluation  Patient identified by MRN, date of birth, ID band Patient awake    Reviewed: Allergy & Precautions, NPO status , Patient's Chart, lab work & pertinent test results  History of Anesthesia Complications Negative for: history of anesthetic complications  Airway Mallampati: IV   Neck ROM: Full    Dental  (+) Implants   Pulmonary neg pulmonary ROS   Pulmonary exam normal breath sounds clear to auscultation       Cardiovascular Exercise Tolerance: Good + dysrhythmias Atrial Fibrillation  Rhythm:Regular Rate:Normal  ECHO 08/2021:  1. Left ventricular ejection fraction, by estimation, is 40 to 45%. Left  ventricular ejection fraction by PLAX is 47 %. The left ventricle has  mildly decreased function. The left ventricle demonstrates global  hypokinesis. There is moderate left  ventricular hypertrophy. Left ventricular diastolic parameters are  indeterminate. The average left ventricular global longitudinal strain is  -11.1 %.   2. Right ventricular systolic function is mildly reduced. The right  ventricular size is normal. There is normal pulmonary artery systolic  pressure. The estimated right ventricular systolic pressure is 31.4 mmHg.   3. Left atrial size was mildly dilated.   4. The mitral valve is normal in structure. Mild mitral valve  regurgitation. No evidence of mitral stenosis.   5. The aortic valve is tricuspid. Aortic valve regurgitation is moderate.  Aortic valve sclerosis is present, with no evidence of aortic valve  stenosis.   6. There is mild dilatation of the aortic root, measuring 42 mm.   7. The inferior vena cava is normal in size with greater than 50%  respiratory variability, suggesting right atrial pressure of 3 mmHg.     Neuro/Psych  PSYCHIATRIC DISORDERS Anxiety     negative neurological ROS     GI/Hepatic negative GI ROS,,,  Endo/Other  negative endocrine ROS     Renal/GU negative Renal ROS     Musculoskeletal   Abdominal   Peds  Hematology negative hematology ROS (+)   Anesthesia Other Findings   Reproductive/Obstetrics                              Anesthesia Physical Anesthesia Plan  ASA: 3  Anesthesia Plan: General   Post-op Pain Management:    Induction: Intravenous  PONV Risk Score and Plan: 2 and Propofol infusion, TIVA and Treatment may vary due to age or medical condition  Airway Management Planned: Natural Airway  Additional Equipment:   Intra-op Plan:   Post-operative Plan:   Informed Consent:   Plan Discussed with: CRNA  Anesthesia Plan Comments:         Anesthesia Quick Evaluation

## 2022-09-12 NOTE — Anesthesia Postprocedure Evaluation (Signed)
Anesthesia Post Note  Patient: Alex Burch  Procedure(s) Performed: CARDIOVERSION  Patient location during evaluation: [facility] Recovery Anesthesia Type: General Level of consciousness: awake and alert Pain management: pain level controlled Vital Signs Assessment: post-procedure vital signs reviewed and stable Respiratory status: spontaneous breathing, nonlabored ventilation and respiratory function stable Cardiovascular status: blood pressure returned to baseline and stable Postop Assessment: no apparent nausea or vomiting Anesthetic complications: no   No notable events documented.   Last Vitals:  Vitals:   09/11/22 0830 09/11/22 0834  BP: 121/80 122/75  Pulse: (!) 47 (!) 49  Resp: 15 16  Temp:    SpO2: 99% 99%    Last Pain:  Vitals:   09/11/22 0834  TempSrc:   PainSc: 0-No pain                 Foye Deer

## 2022-09-14 NOTE — H&P (Signed)
ID:  Alex Burch, DOB [birth year], MRN [id number]   PCP:  Jaclyn Shaggy, MD             Chief Complaint  Patient presents with   Atrial flutter    Preop for cardioversion      HPI:  Alex Burch is a 67 year old gentleman with past medical history of COVID 3 weeks ago Who presented for preop evaluation prior to cardioversion for atrial flutter   Arrhythmia noted before colonoscopy  EKG confirming atrial flutter  Denies significant lower extremity edema, no abdominal bloating No near-syncope or syncope   EKG personally reviewed by myself on todays visit Atrial flutter controlled ventricular rate     PMH:   has a past medical history of Actinic keratosis, Anxiety, Degenerative joint disease, Hypercholesterolemia, Plantar fasciitis, Right inguinal hernia, and Tendinitis of right hip.   PSH:         Past Surgical History:  Procedure Laterality Date   COLONOSCOPY       INGUINAL HERNIA REPAIR Right 05/10/2020    Procedure: HERNIA REPAIR INGUINAL ADULT;  Surgeon: Orson Ape, MD;  Location: [facility] ORS;  Service: Urology;  Laterality: Right;   NASAL SEPTUM SURGERY       TONSILLECTOMY                Current Outpatient Medications  Medication Sig Dispense Refill   ALPRAZolam (XANAX) 0.25 MG tablet Take 0.25 mg by mouth 2 (two) times daily as needed for anxiety.        apixaban (ELIQUIS) 5 MG TABS tablet Take 1 tablet (5 mg total) by mouth 2 (two) times daily. 60 tablet 1   docusate sodium (COLACE) 100 MG capsule Take 2 capsules (200 mg total) by mouth 2 (two) times daily. 120 capsule 3   FLUoxetine (PROZAC) 20 MG tablet Take 60 mg by mouth daily.        meloxicam (MOBIC) 15 MG tablet Take 1 tablet (15 mg total) by mouth daily. 30 tablet 3   metoprolol succinate (TOPROL XL) 25 MG 24 hr tablet Take 1 tablet (25 mg total) by mouth daily. 30 tablet 1   rosuvastatin (CRESTOR) 10 MG tablet Take 10 mg by mouth daily.        sulfamethoxazole-trimethoprim (BACTRIM  DS) 800-160 MG tablet Take 1 tablet by mouth 2 (two) times daily. 14 tablet 0   acetaminophen-codeine (TYLENOL #3) 300-30 MG tablet Take 1-2 tablets by mouth every 4 (four) hours as needed for moderate pain. (Patient not taking: Reported on 07/22/2022) 20 tablet 0   fluorouracil (EFUDEX) 5 % cream Apply topically 2 (two) times daily. Apply to face for 7 days (Patient not taking: Reported on 07/22/2022) 15 g 1   mometasone (ELOCON) 0.1 % ointment Apply topically in the morning and at bedtime. For 1 week. D/c if rash improves (Patient not taking: Reported on 07/22/2022) 45 g 0    No current facility-administered medications for this visit.        Allergies:   Penicillins    Social History:  The patient  reports that he has never smoked. He has never used smokeless tobacco. He reports current alcohol use. He reports that he does not use drugs.    Family History:   family history includes Arrhythmia in his brother; Atrial fibrillation in his brother; Heart disease in his father; Heart failure in his father; Hyperlipidemia in his father; Hypertension in his father.      Review of Systems:  Review of Systems  Constitutional: Negative.   HENT: Negative.    Respiratory: Negative.    Cardiovascular: Negative.   Gastrointestinal: Negative.   Musculoskeletal: Negative.   Neurological: Negative.   Psychiatric/Behavioral: Negative.    All other systems reviewed and are negative.       PHYSICAL EXAM: VS:  BP (!) 150/70 (BP Location: Right Arm, Patient Position: Sitting, Cuff Size: Normal)   Pulse 82   Ht 6' (1.829 m)   Wt 175 lb 4 oz (79.5 kg)   SpO2 98%   BMI 23.77 kg/m  , BMI Body mass index is 23.77 kg/m. On examination : alert oriented, no JVD, lungs clear to auscultation bilaterally, heart sounds regular normal S1-S2 no murmurs appreciated, abdomen soft nontender no significant lower extremity edema.  Musculoskeletal exam with good range of motion, neurologic exam grossly nonfocal  Recent  Labs: No results found for requested labs within last 365 days.      Lipid Panel Recent Labs  No results found for: "CHOL", "HDL", "LDLCALC", "TRIG"          Wt Readings from Last 3 Encounters:  07/22/22 175 lb 4 oz (79.5 kg)  07/21/22 174 lb 9.6 oz (79.2 kg)  05/10/20 180 lb (81.6 kg)          ASSESSMENT AND PLAN:   Problem List Items Addressed This Visit   None Visit Diagnoses       Atrial flutter, unspecified type (HCC)    -  Primary    Relevant Medications    metoprolol succinate (TOPROL XL) 25 MG 24 hr tablet    apixaban (ELIQUIS) 5 MG TABS tablet    Other Relevant Orders    EKG 12-Lead    ECHOCARDIOGRAM COMPLETE    Mixed hyperlipidemia        Relevant Medications    metoprolol succinate (TOPROL XL) 25 MG 24 hr tablet    apixaban (ELIQUIS) 5 MG TABS tablet         Atrial flutter New onset, before colonoscopy No recent EKGs apart from 2005 Has been taking Eliquis 5 twice daily in a consistent manner over the past month  metoprolol succinate 25 for rate control We will proceed with cardioversion today  Patient understands risk and benefit of the procedure,  The risks (stroke, cardiac arrhythmias rarely resulting in the need for a temporary or permanent pacemaker, skin irritation or burns and complications associated with conscious sedation including aspiration, arrhythmia, respiratory failure and death), benefits (restoration of normal sinus rhythm) and alternatives of a direct current cardioversion were explained in detail Patient willing to proceed.     Signed, Dossie Arbour, M.D., Ph.D. [facility] [place], [place] [phone]

## 2022-09-21 ENCOUNTER — Encounter: Payer: Self-pay | Admitting: Dermatology

## 2022-09-29 NOTE — Progress Notes (Unsigned)
[name] will go it is the end of the day L1 to cardiology Office Note  Date:  09/30/2022   ID:  Alex Burch, DOB 1955/09/24, MRN [id number]  PCP:  Jaclyn Shaggy, MD   Chief Complaint  Patient presents with   Follow Echo & s/p Cardioversion    "Doing well." Medications reviewed by the patient verbally.     HPI:  Mr. Alex Burch is a 67 year old gentleman with past medical history of COVID January into February 2024  Evaluated February 2024 with new onset atrial flutter possibly from colonoscopy prep Who presents for follow-up of his atrial flutter and cardioversion September 11, 2022  Last seen by myself in clinic February 2024  found to be in atrial flutter at the time of his colonoscopy February 2024 Started on eliquis, metoprolol  Echo September 02, 2022 Shows mildly depressed left ventricular function 40 to 45%, should be 55% or higher Some thickening of the wall also noted/hypertrophy By ventricle also appears mildly reduced, normal size Aortic valve with moderate regurgitation Mild dilation of aortic root   cardioversion September 11, 2022 Normal sinus rhythm restored  Has felt well since that time More issues with anxiety, working with primary care, has been referred to psychiatry Reports he has been weaning his Prozac down, taking Klonopin as needed for sleep  EKG personally reviewed by myself on todays visit Sinus bradycardia rate 57 bpm no significant ST-T wave changes   PMH:   has a past medical history of Actinic keratosis, Anxiety, Degenerative joint disease, Dysrhythmia, Hypercholesterolemia, Plantar fasciitis, Right inguinal hernia, and Tendinitis of right hip.  PSH:    Past Surgical History:  Procedure Laterality Date   CARDIOVERSION N/A 09/11/2022   Procedure: CARDIOVERSION;  Surgeon: Antonieta Iba, MD;  Location: [facility] ORS;  Service: Cardiovascular;  Laterality: N/A;   COLONOSCOPY     INGUINAL HERNIA REPAIR Right 05/10/2020   Procedure: HERNIA REPAIR  INGUINAL ADULT;  Surgeon: Orson Ape, MD;  Location: [facility] ORS;  Service: Urology;  Laterality: Right;   NASAL SEPTUM SURGERY     TONSILLECTOMY      Current Outpatient Medications  Medication Sig Dispense Refill   ALPRAZolam (XANAX) 0.25 MG tablet Take 0.25 mg by mouth 2 (two) times daily as needed for anxiety.      apixaban (ELIQUIS) 5 MG TABS tablet Take 1 tablet (5 mg total) by mouth 2 (two) times daily. 60 tablet 1   clonazePAM (KLONOPIN) 1 MG tablet Take 0.5-1 mg by mouth 2 (two) times daily as needed.     meloxicam (MOBIC) 15 MG tablet Take 1 tablet (15 mg total) by mouth daily. 30 tablet 3   metoprolol succinate (TOPROL-XL) 25 MG 24 hr tablet TAKE 1 TABLET BY MOUTH DAILY 30 tablet 1   rosuvastatin (CRESTOR) 10 MG tablet Take 10 mg by mouth daily.      acetaminophen-codeine (TYLENOL #3) 300-30 MG tablet Take 1-2 tablets by mouth every 4 (four) hours as needed for moderate pain. (Patient not taking: Reported on 07/22/2022) 20 tablet 0   docusate sodium (COLACE) 100 MG capsule Take 2 capsules (200 mg total) by mouth 2 (two) times daily. (Patient not taking: Reported on 09/30/2022) 120 capsule 3   FLUoxetine (PROZAC) 20 MG tablet Take 60 mg by mouth daily.  (Patient not taking: Reported on 09/30/2022)     sulfamethoxazole-trimethoprim (BACTRIM DS) 800-160 MG tablet Take 1 tablet by mouth 2 (two) times daily. (Patient not taking: Reported on 09/11/2022) 14 tablet  0   No current facility-administered medications for this visit.     Allergies:   Penicillins   Social History:  The patient  reports that he has never smoked. He has never used smokeless tobacco. He reports current alcohol use. He reports that he does not use drugs.   Family History:   family history includes Arrhythmia in his brother; Atrial fibrillation in his brother; Heart disease in his father; Heart failure in his father; Hyperlipidemia in his father; Hypertension in his father.    Review of Systems: Review of Systems   Constitutional: Negative.   HENT: Negative.    Respiratory: Negative.    Cardiovascular: Negative.   Gastrointestinal: Negative.   Musculoskeletal: Negative.   Neurological: Negative.   Psychiatric/Behavioral:  The patient is nervous/anxious.   All other systems reviewed and are negative.   PHYSICAL EXAM: VS:  BP (!) 116/58 (BP Location: Left Arm, Patient Position: Sitting, Cuff Size: Normal)   Pulse (!) 57   Ht 6' (1.829 m)   Wt 165 lb (74.8 kg)   SpO2 98%   BMI 22.38 kg/m  , BMI Body mass index is 22.38 kg/m. Constitutional:  oriented to person, place, and time. No distress.  HENT:  Head: Grossly normal Eyes:  no discharge. No scleral icterus.  Neck: No JVD, no carotid bruits  Cardiovascular: Regular rate and rhythm, no murmurs appreciated Pulmonary/Chest: Clear to auscultation bilaterally, no wheezes or rails Abdominal: Soft.  no distension.  no tenderness.  Musculoskeletal: Normal range of motion Neurological:  normal muscle tone. Coordination normal. No atrophy Skin: Skin warm and dry Psychiatric: normal affect, pleasant  Recent Labs: 09/09/2022: BUN 16; Creatinine, Ser 1.02; Hemoglobin 14.4; Platelets 206; Potassium 3.9; Sodium 140    Lipid Panel No results found for: "CHOL", "HDL", "LDLCALC", "TRIG"    Wt Readings from Last 3 Encounters:  09/30/22 165 lb (74.8 kg)  09/11/22 170 lb (77.1 kg)  07/22/22 175 lb 4 oz (79.5 kg)       ASSESSMENT AND PLAN:  Problem List Items Addressed This Visit       Cardiology Problems   Typical atrial flutter (HCC) - Primary   Relevant Orders   EKG 12-Lead     Other   SOB (shortness of breath)   Other Visit Diagnoses     Syncope, unspecified syncope type       Relevant Orders   EKG 12-Lead   Mixed hyperlipidemia       Relevant Orders   EKG 12-Lead     Atrial flutter new onset February 2024 at the time of his colonoscopy prep Cardioversion April 2024, normal sinus rhythm restored Mildly depressed EF on  echo in the setting of atrial flutter Currently feels well, maintaining normal sinus rhythm Recommended he continue metoprolol and Eliquis Suggested he hold Eliquis 2 days before any reattempt at colonoscopy but stay on metoprolol. (Acceptable risk for colonoscopy, no further cardiac workup needed)  Anxiety Business issues, runs a local shop Meeting with psychiatry later this week, weaning down on his Prozac, has Klonopin  Hyperlipidemia On Crestor 10 mg daily   Total encounter time more than 30 minutes  Greater than 50% was spent in counseling and coordination of care with the patient   Signed, Dossie Arbour, M.D., Ph.D. [facility] [place], [place] [phone]

## 2022-09-30 ENCOUNTER — Ambulatory Visit: Payer: Medicare Other | Attending: Cardiovascular Disease | Admitting: Cardiovascular Disease

## 2022-09-30 ENCOUNTER — Encounter: Payer: Self-pay | Admitting: Cardiovascular Disease

## 2022-09-30 VITALS — BP 116/58 | HR 57 | Ht 72.0 in | Wt 165.0 lb

## 2022-09-30 DIAGNOSIS — I483 Typical atrial flutter: Secondary | ICD-10-CM | POA: Insufficient documentation

## 2022-09-30 DIAGNOSIS — R55 Syncope and collapse: Secondary | ICD-10-CM | POA: Insufficient documentation

## 2022-09-30 DIAGNOSIS — E782 Mixed hyperlipidemia: Secondary | ICD-10-CM | POA: Insufficient documentation

## 2022-09-30 DIAGNOSIS — R0602 Shortness of breath: Secondary | ICD-10-CM | POA: Diagnosis not present

## 2022-09-30 NOTE — Patient Instructions (Addendum)
Medication Instructions:  No changes  If you need a refill on your cardiac medications before your next appointment, please call your pharmacy.    Lab work: No new labs needed   Testing/Procedures: No new testing needed   Follow-Up: At [facility], you and your health needs are our priority.  As part of our continuing mission to provide you with exceptional heart care, we have created designated Provider Care Teams.  These Care Teams include your primary Cardiologist (physician) and Advanced Practice Providers (APPs -  Physician Assistants and Nurse Practitioners) who all work together to provide you with the care you need, when you need it.  You will need a follow up appointment in 6 months  Providers on your designated Care Team:   Christopher Berge, NP Ryan Dunn, PA-C Cadence Furth, PA-C  COVID-19 Vaccine Information can be found at: [email or web address] For questions related to vaccine distribution or appointments, please email [email or web address] or call [phone].   

## 2022-10-23 ENCOUNTER — Other Ambulatory Visit: Payer: Self-pay | Admitting: Cardiovascular Disease

## 2022-10-23 NOTE — Telephone Encounter (Signed)
Prescription refill request for Eliquis received. Indication:aflutter Last office visit:5/24 Scr:1.0 Age:67  Weight:74.8  kg  Prescription refilled

## 2022-10-23 NOTE — Telephone Encounter (Signed)
Please review

## 2022-10-28 ENCOUNTER — Other Ambulatory Visit: Payer: Self-pay | Admitting: Cardiovascular Disease

## 2022-11-06 ENCOUNTER — Telehealth: Payer: Self-pay | Admitting: Cardiovascular Disease

## 2022-11-06 NOTE — Telephone Encounter (Signed)
   Primary Cardiologist: Julien Nordmann, MD  Chart reviewed as part of pre-operative protocol coverage. Given past medical history and time since last visit, based on ACC/AHA guidelines, Alex Burch would be at acceptable risk for the planned procedure without further cardiovascular testing.   Patient was advised that if he develops new symptoms prior to surgery to contact our office to arrange a follow-up appointment.  He verbalized understanding.  Per office protocol, he may hold Eliquis for 2 days prior to procedure and should resume as soon as hemodynamically stable.   I will route this recommendation to the requesting party via Epic fax function and remove from pre-op pool.  Please call with questions.  Alex Aland, NP-C  11/06/2022, 3:13 PM [address] Office [phone] Fax [phone]

## 2022-11-06 NOTE — Telephone Encounter (Signed)
   Pre-operative Risk Assessment    Patient Name: Alex Burch  DOB: 1956-04-30 MRN: [id number]      Request for Surgical Clearance    Procedure:  Colonoscopy  Date of Surgery:  Clearance 11/10/22                                 Surgeon:  Dr. Briant Sites Surgeon's Group or Practice Name:  [facility] Phone number:  [phone] Fax number:  [phone]   Type of Clearance Requested:   - Medical    Type of Anesthesia:  Not Indicated   Additional requests/questions:    Signed, Narda Amber   11/06/2022, 2:48 PM

## 2022-11-07 NOTE — Telephone Encounter (Signed)
Lvm per pt that pt has been cleared for surgery and can hold eliquis 2 days prior. Stated this was sent via epic to Dr. Dewayne Hatch Honor's office.

## 2022-11-07 NOTE — Telephone Encounter (Signed)
Patient wants call back on status of pre-op clearance.  Patient state can leave VM.

## 2022-12-02 ENCOUNTER — Ambulatory Visit: Payer: Medicare Other | Admitting: Dermatology

## 2022-12-02 VITALS — BP 122/79 | HR 69

## 2022-12-02 DIAGNOSIS — W908XXA Exposure to other nonionizing radiation, initial encounter: Secondary | ICD-10-CM

## 2022-12-02 DIAGNOSIS — L814 Other melanin hyperpigmentation: Secondary | ICD-10-CM | POA: Diagnosis not present

## 2022-12-02 DIAGNOSIS — L570 Actinic keratosis: Secondary | ICD-10-CM | POA: Diagnosis not present

## 2022-12-02 DIAGNOSIS — B070 Plantar wart: Secondary | ICD-10-CM

## 2022-12-02 NOTE — Patient Instructions (Addendum)
Counseling for BBL / IPL / Laser and Coordination of Care Discussed the treatment option of Broad Band Light (BBL) /Intense Pulsed Light (IPL)/ Laser for skin discoloration, including brown spots and redness.  Typically we recommend at least 1-3 treatment sessions about 5-8 weeks apart for best results.  Cannot have tanned skin when BBL performed, and regular use of sunscreen is advised after the procedure to help maintain results. The patient's condition may also require "maintenance treatments" in the future.  The fee for BBL / laser treatments is $350 per treatment session for the whole face.  A fee can be quoted for other parts of the body.  Insurance typically does not pay for BBL/laser treatments and therefore the fee is an out-of-pocket cost.      Cryotherapy Aftercare  Wash gently with soap and water everyday.   Apply Vaseline and Band-Aid daily until healed.    Cantharidin Plus is a blistering agent that comes from a beetle.  It needs to be washed off in about 4 hours after application.  Although it is painless when applied in office, it may cause symptoms of mild pain and burning several hours later.  Treated areas will swell and turn red, and blisters may form.  Vaseline and a bandaid may be applied until wound has healed.  Once healed, the skin may remain temporarily discolored.  It can take weeks to months for pigmentation to return to normal.  Advised to wash off with soap and water in 6 hours or sooner if it becomes tender before then.     Due to recent changes in healthcare laws, you may see results of your pathology and/or laboratory studies on MyChart before the doctors have had a chance to review them. We understand that in some cases there may be results that are confusing or concerning to you. Please understand that not all results are received at the same time and often the doctors may need to interpret multiple results in order to provide you with the best plan of care or  course of treatment. Therefore, we ask that you please give [place] 2 business days to thoroughly review all your results before contacting the office for clarification. Should we see a critical lab result, you will be contacted sooner.   If You Need Anything After Your Visit  If you have any questions or concerns for your doctor, please call our main line at [phone] and press option 4 to reach your doctor's medical assistant. If no one answers, please leave a voicemail as directed and we will return your call as soon as possible. Messages left after 4 pm will be answered the following business day.   You may also send [place] a message via MyChart. We typically respond to MyChart messages within 1-2 business days.  For prescription refills, please ask your pharmacy to contact our office. Our fax number is [phone].  If you have an urgent issue when the clinic is closed that cannot wait until the next business day, you can page your doctor at the number below.    Please note that while we do our best to be available for urgent issues outside of office hours, we are not available 24/7.   If you have an urgent issue and are unable to reach [place], you may choose to seek medical care at your doctor's office, retail clinic, urgent care center, or emergency room.  If you have a medical emergency, please immediately call 911 or go to the emergency  department.  Pager Numbers  - Dr. Gwen Pounds: [phone]  - Dr. Neale Burly: [phone]  - Dr. Roseanne Reno: [phone]  In the event of inclement weather, please call our main line at [phone] for an update on the status of any delays or closures.  Dermatology Medication Tips: Please keep the boxes that topical medications come in in order to help keep track of the instructions about where and how to use these. Pharmacies typically print the medication instructions only on the boxes and not directly on the medication tubes.   If your medication is too  expensive, please contact our office at [phone] option 4 or send [place] a message through MyChart.   We are unable to tell what your co-pay for medications will be in advance as this is different depending on your insurance coverage. However, we may be able to find a substitute medication at lower cost or fill out paperwork to get insurance to cover a needed medication.   If a prior authorization is required to get your medication covered by your insurance company, please allow [place] 1-2 business days to complete this process.  Drug prices often vary depending on where the prescription is filled and some pharmacies may offer cheaper prices.  The website www.goodrx.com contains coupons for medications through different pharmacies. The prices here do not account for what the cost may be with help from insurance (it may be cheaper with your insurance), but the website can give you the price if you did not use any insurance.  - You can print the associated coupon and take it with your prescription to the pharmacy.  - You may also stop by our office during regular business hours and pick up a GoodRx coupon card.  - If you need your prescription sent electronically to a different pharmacy, notify our office through [facility] or by phone at [phone] option 4.     Si Usted Necesita Algo Despus de Su Visita  Tambin puede enviarnos un mensaje a travs de Clinical cytogeneticist. Por lo general respondemos a los mensajes de MyChart en el transcurso de 1 a 2 das hbiles.  Para renovar recetas, por favor pida a su farmacia que se ponga en contacto con nuestra oficina. Annie Sable de fax es [place] [phone].  Si tiene un asunto urgente cuando la clnica est cerrada y que no puede esperar hasta el siguiente da hbil, puede llamar/localizar a su doctor(a) al nmero que aparece a continuacin.   Por favor, tenga en cuenta que aunque hacemos todo lo posible para estar disponibles para asuntos urgentes fuera  del horario de [place], no estamos disponibles las 24 horas del da, los 7 [address]  [address] de la [place].   Si tiene un problema urgente y no puede comunicarse con nosotros, puede optar por buscar atencin mdica  en el consultorio de su doctor(a), en una clnica privada, en un centro de atencin urgente o en una sala de emergencias.  Si tiene Engineer, drilling, por favor llame inmediatamente al 911 o vaya a la sala de emergencias.  Nmeros de bper  - Dr. Gwen Pounds: [phone]  - Dra. Moye: [phone]  - Dra. Roseanne Reno: [phone]  En caso de inclemencias del [place], por favor llame a Lacy Duverney principal al [phone] para una actualizacin sobre el [place] de cualquier retraso o cierre.  Consejos para la medicacin en dermatologa: Por favor, guarde las cajas en las que vienen los medicamentos de uso tpico para ayudarle a seguir las instrucciones sobre dnde y cmo usarlos. [employer]  generalmente imprimen las instrucciones del medicamento slo en las cajas y no directamente en los tubos del [place].   Si su medicamento es muy caro, por favor, pngase en contacto con Rolm Gala llamando al [phone] y presione la opcin 4 o envenos un mensaje a travs de Clinical cytogeneticist.   No podemos decirle cul ser su copago por los medicamentos por adelantado ya que esto es diferente dependiendo de la cobertura de su seguro. Sin embargo, es posible que podamos encontrar un medicamento sustituto a Audiological scientist un formulario para que el seguro cubra el medicamento que se considera necesario.   Si se requiere una autorizacin previa para que su compaa de seguros [place] su medicamento, por favor permtanos de 1 a 2 das hbiles para completar [address]  Los precios de los medicamentos varan con frecuencia dependiendo del Environmental consultant de dnde se surte la receta y alguna farmacias pueden ofrecer precios ms baratos.  El sitio web www.goodrx.com tiene cupones para medicamentos de Engineer, civil (consulting). Los precios aqu no tienen en cuenta lo que podra costar con la ayuda del seguro (puede ser ms barato con su seguro), pero el sitio web puede darle el precio si no utiliz Tourist information centre manager.  - Puede imprimir el cupn correspondiente y llevarlo con su receta a la farmacia.  - Tambin puede pasar por nuestra oficina durante el horario de atencin regular y Education officer, museum una tarjeta de cupones de GoodRx.  - Si necesita que su receta se enve electrnicamente a una farmacia diferente, informe a nuestra oficina a travs de MyChart de [place] o por telfono llamando al [phone] y presione la opcin 4.

## 2022-12-02 NOTE — Progress Notes (Signed)
Follow-Up Visit   Subjective  Alex Burch is a 67 y.o. male who presents for the following: The patient has spots on his left foot to be evaluated, new or changing, sometimes tender when patient walk.  Patient c/o scaly patch on the left side burn.  The following portions of the chart were reviewed this encounter and updated as appropriate: medications, allergies, medical history  Review of Systems:  No other skin or systemic complaints except as noted in HPI or Assessment and Plan.  Objective  Well appearing patient in no apparent distress; mood and affect are within normal limits.  A focused examination was performed of the following areas: left foot    Relevant exam findings are noted in the Assessment and Plan.  left lateral plantar foot Firm depressed papules x 2 lateral plantar foot, ~5mm  left side burn x 1 Erythematous thin papules/macules with gritty scale.    Assessment & Plan    Plantar wart left lateral plantar foot  Viral Wart (HPV) Counseling  Discussed viral / HPV (Human Papilloma Virus) etiology and risk of spread /infectivity to other areas of body as well as to other people.  Multiple treatments and methods may be required to clear warts and it is possible treatment may not be successful.  Treatment risks include discoloration; scarring and there is still potential for wart recurrence.   LN2 x 2 Squaric Acid 3% applied to warts today. Prior to application reviewed risk of inflammation and irritation.  Cantharidin Plus applied  Cantharidin Plus is a blistering agent that comes from a beetle.  It needs to be washed off in about 4-6 hours after application.  Although it is painless when applied in office, it may cause symptoms of mild pain and burning several hours later.  Treated areas will swell and turn red, and blisters may form.  Vaseline and a bandaid may be applied until wound has healed.  Once healed, the skin may remain temporarily discolored.   It can take weeks to months for pigmentation to return to normal.  Advised to wash off with soap and water in 4-6 hours or sooner if it becomes tender before then.   Destruction of lesion - left lateral plantar foot  Destruction method: cryotherapy   Informed consent: discussed and consent obtained   Timeout:  patient name, date of birth, surgical site, and procedure verified Lesion destroyed using liquid nitrogen: Yes   Region frozen until ice ball extended beyond lesion: Yes   Outcome: patient tolerated procedure well with no complications   Post-procedure details: wound care instructions given   Additional details:  Prior to procedure, discussed risks of blister formation, small wound, skin dyspigmentation, or rare scar following cryotherapy. Recommend Vaseline ointment to treated areas while healing.   Destruction of lesion - left lateral plantar foot  Destruction method: chemical removal   Informed consent: discussed and consent obtained   Timeout:  patient name, date of birth, surgical site, and procedure verified Chemical destruction method: cantharidin   Application time:  6 hours Procedure instructions: patient instructed to wash and dry area   Outcome: patient tolerated procedure well with no complications   Post-procedure details: wound care instructions given   Additional details:  Patient advised to set alarm to remind them to wash off with soap and water at the directed time.  AK (actinic keratosis) left side burn x 1  Actinic keratoses are precancerous spots that appear secondary to cumulative UV radiation exposure/sun exposure over time.  They are chronic with expected duration over 1 year. A portion of actinic keratoses will progress to squamous cell carcinoma of the skin. It is not possible to reliably predict which spots will progress to skin cancer and so treatment is recommended to prevent development of skin cancer.  Recommend daily broad spectrum sunscreen SPF 30+  to sun-exposed areas, reapply every 2 hours as needed.  Recommend staying in the shade or wearing long sleeves, sun glasses (UVA+UVB protection) and wide brim hats (4-inch brim around the entire circumference of the hat). Call for new or changing lesions.   Destruction of lesion - left side burn x 1  Destruction method: cryotherapy   Informed consent: discussed and consent obtained   Timeout:  patient name, date of birth, surgical site, and procedure verified Lesion destroyed using liquid nitrogen: Yes   Region frozen until ice ball extended beyond lesion: Yes   Outcome: patient tolerated procedure well with no complications   Post-procedure details: wound care instructions given   Additional details:  Prior to procedure, discussed risks of blister formation, small wound, skin dyspigmentation, or rare scar following cryotherapy. Recommend Vaseline ointment to treated areas while healing.    LENTIGINES Exam: scattered tan macules face Due to sun exposure Treatment Plan: Benign-appearing, observe. Recommend daily broad spectrum sunscreen SPF 30+ to sun-exposed areas, reapply every 2 hours as needed.  Call for any changes   Counseling for BBL / IPL / Laser and Coordination of Care Discussed the treatment option of Broad Band Light (BBL) /Intense Pulsed Light (IPL)/ Laser for skin discoloration, including brown spots and redness.  Typically we recommend at least 1-3 treatment sessions about 5-8 weeks apart for best results.  Cannot have tanned skin when BBL performed, and regular use of sunscreen is advised after the procedure to help maintain results. The patient's condition may also require "maintenance treatments" in the future.  The fee for BBL / laser treatments is $350 per treatment session for the whole face.  A fee can be quoted for other parts of the body.  Insurance typically does not pay for BBL/laser treatments and therefore the fee is an out-of-pocket cost.   Return for follow up Dr  Kirtland Bouchard .  I, Angelique Holm, CMA, am acting as scribe for Willeen Niece, MD .   Documentation: I have reviewed the above documentation for accuracy and completeness, and I agree with the above.  Willeen Niece, MD

## 2022-12-10 ENCOUNTER — Ambulatory Visit: Payer: BC Managed Care – PPO | Admitting: Dermatology

## 2022-12-29 ENCOUNTER — Ambulatory Visit: Payer: BC Managed Care – PPO | Admitting: Dermatology

## 2023-01-07 ENCOUNTER — Ambulatory Visit (INDEPENDENT_AMBULATORY_CARE_PROVIDER_SITE_OTHER): Payer: Medicare Other | Admitting: Dermatology

## 2023-01-07 ENCOUNTER — Encounter: Payer: Self-pay | Admitting: Dermatology

## 2023-01-07 VITALS — BP 121/66

## 2023-01-07 DIAGNOSIS — L570 Actinic keratosis: Secondary | ICD-10-CM

## 2023-01-07 DIAGNOSIS — Z808 Family history of malignant neoplasm of other organs or systems: Secondary | ICD-10-CM

## 2023-01-07 DIAGNOSIS — W908XXA Exposure to other nonionizing radiation, initial encounter: Secondary | ICD-10-CM

## 2023-01-07 DIAGNOSIS — L603 Nail dystrophy: Secondary | ICD-10-CM

## 2023-01-07 DIAGNOSIS — L578 Other skin changes due to chronic exposure to nonionizing radiation: Secondary | ICD-10-CM | POA: Diagnosis not present

## 2023-01-07 DIAGNOSIS — D1801 Hemangioma of skin and subcutaneous tissue: Secondary | ICD-10-CM

## 2023-01-07 DIAGNOSIS — L821 Other seborrheic keratosis: Secondary | ICD-10-CM

## 2023-01-07 DIAGNOSIS — L609 Nail disorder, unspecified: Secondary | ICD-10-CM

## 2023-01-07 DIAGNOSIS — Z7189 Other specified counseling: Secondary | ICD-10-CM

## 2023-01-07 DIAGNOSIS — L719 Rosacea, unspecified: Secondary | ICD-10-CM

## 2023-01-07 DIAGNOSIS — L814 Other melanin hyperpigmentation: Secondary | ICD-10-CM | POA: Diagnosis not present

## 2023-01-07 DIAGNOSIS — Z1283 Encounter for screening for malignant neoplasm of skin: Secondary | ICD-10-CM | POA: Diagnosis not present

## 2023-01-07 DIAGNOSIS — Z872 Personal history of diseases of the skin and subcutaneous tissue: Secondary | ICD-10-CM

## 2023-01-07 DIAGNOSIS — D229 Melanocytic nevi, unspecified: Secondary | ICD-10-CM

## 2023-01-07 DIAGNOSIS — Z79899 Other long term (current) drug therapy: Secondary | ICD-10-CM

## 2023-01-07 NOTE — Progress Notes (Signed)
Follow-Up Visit   Subjective  Alex Burch is a 67 y.o. male who presents for the following: Skin Cancer Screening and Full Body Skin Exam - History of AK  The patient presents for Total-Body Skin Exam (TBSE) for skin cancer screening and mole check. The patient has spots, moles and lesions to be evaluated, some may be new or changing and the patient may have concern these could be cancer.  The following portions of the chart were reviewed this encounter and updated as appropriate: medications, allergies, medical history  Review of Systems:  No other skin or systemic complaints except as noted in HPI or Assessment and Plan.  Objective  Well appearing patient in no apparent distress; mood and affect are within normal limits.  A full examination was performed including scalp, head, eyes, ears, nose, lips, neck, chest, axillae, abdomen, back, buttocks, bilateral upper extremities, bilateral lower extremities, hands, feet, fingers, toes, fingernails, and toenails. All findings within normal limits unless otherwise noted below.   Relevant physical exam findings are noted in the Assessment and Plan.  Right cheek Erythematous thin papules/macules with gritty scale.    Assessment & Plan   SKIN CANCER SCREENING PERFORMED TODAY.  ACTINIC DAMAGE - Chronic condition, secondary to cumulative UV/sun exposure - diffuse scaly erythematous macules with underlying dyspigmentation - Recommend daily broad spectrum sunscreen SPF 30+ to sun-exposed areas, reapply every 2 hours as needed.  - Staying in the shade or wearing long sleeves, sun glasses (UVA+UVB protection) and wide brim hats (4-inch brim around the entire circumference of the hat) are also recommended for sun protection.  - Call for new or changing lesions.  LENTIGINES, SEBORRHEIC KERATOSES, HEMANGIOMAS - Benign normal skin lesions - Benign-appearing - Call for any changes  MELANOCYTIC NEVI - Tan-brown and/or  pink-flesh-colored symmetric macules and papules - Benign appearing on exam today - Observation - Call clinic for new or changing moles - Recommend daily use of broad spectrum spf 30+ sunscreen to sun-exposed areas.   ROSACEA Exam Mid face erythema with telangiectasias  Chronic and persistent condition with duration or expected duration over one year. Condition is symptomatic / bothersome to patient. Not to goal.  Rosacea is a chronic progressive skin condition usually affecting the face of adults, causing redness and/or acne bumps. It is treatable but not curable. It sometimes affects the eyes (ocular rosacea) as well. It may respond to topical and/or systemic medication and can flare with stress, sun exposure, alcohol, exercise, topical steroids (including hydrocortisone/cortisone 10) and some foods.  Daily application of broad spectrum spf 30+ sunscreen to face is recommended to reduce flares.  Treatment Plan Will prescribe [facility] metronidazole/ivermectin/azelaic acid twice daily as needed to affected areas on the face. The patient was advised this is not covered by insurance since it is made by a compounding pharmacy. They will receive an email to check out and the medication will be mailed to their home.   Long term medication management.  Patient is using long term (months to years) prescription medication  to control their dermatologic condition.  These medications require periodic monitoring to evaluate for efficacy and side effects and may require periodic laboratory monitoring.  Family History of Melanoma in Sister. - Recommend daily broad spectrum sunscreen SPF 30+ to sun-exposed areas, reapply every 2 hours as needed.  - Recommend staying in the shade or wearing long sleeves, sun glasses (UVA+UVB protection) and wide brim hats (4-inch brim around the entire circumference of the hat). - Call for  new or changing lesions.  Nail Dystrophy secondary to trauma Exam: Nail  dystrophy Treatment Plan: Recommend Kerasal Nail Repair  AK (actinic keratosis) Right cheek  May consider topical treatment if it persists.  Destruction of lesion - Right cheek Complexity: simple   Destruction method: cryotherapy   Informed consent: discussed and consent obtained   Timeout:  patient name, date of birth, surgical site, and procedure verified Lesion destroyed using liquid nitrogen: Yes   Region frozen until ice ball extended beyond lesion: Yes   Outcome: patient tolerated procedure well with no complications   Post-procedure details: wound care instructions given    Actinic skin damage  Skin cancer screening  Lentigo  Melanocytic nevus, unspecified location  Rosacea  Medication management  Counseling and coordination of care  Nail problem  Family history of skin cancer   Return for Follow up.  I, Joanie Coddington, CMA, am acting as scribe for Armida Sans, MD .  Documentation: I have reviewed the above documentation for accuracy and completeness, and I agree with the above.  Armida Sans, MD

## 2023-01-07 NOTE — Patient Instructions (Addendum)
Instructions for [facility] Medications  One or more of your medications was sent to the [facility] mail order compounding pharmacy. You will receive an email from them and can purchase the medicine through that link. It will then be mailed to your home at the address you confirmed. If for any reason you do not receive an email from them, please check your spam folder. If you still do not find the email, please let us know. [facility] phone number is [phone].     Cryotherapy Aftercare  Wash gently with soap and water everyday.   Apply Vaseline and Band-Aid daily until healed.    Due to recent changes in healthcare laws, you may see results of your pathology and/or laboratory studies on MyChart before the doctors have had a chance to review them. We understand that in some cases there may be results that are confusing or concerning to you. Please understand that not all results are received at the same time and often the doctors may need to interpret multiple results in order to provide you with the best plan of care or course of treatment. Therefore, we ask that you please give [place] 2 business days to thoroughly review all your results before contacting the office for clarification. Should we see a critical lab result, you will be contacted sooner.   If You Need Anything After Your Visit  If you have any questions or concerns for your doctor, please call our main line at [phone] and press option 4 to reach your doctor's medical assistant. If no one answers, please leave a voicemail as directed and we will return your call as soon as possible. Messages left after 4 pm will be answered the following business day.   You may also send [place] a message via MyChart. We typically respond to MyChart messages within 1-2 business days.  For prescription refills, please ask your pharmacy to contact our office. Our fax number is [phone].  If you have an urgent issue when the clinic  is closed that cannot wait until the next business day, you can page your doctor at the number below.    Please note that while we do our best to be available for urgent issues outside of office hours, we are not available 24/7.   If you have an urgent issue and are unable to reach [place], you may choose to seek medical care at your doctor's office, retail clinic, urgent care center, or emergency room.  If you have a medical emergency, please immediately call 911 or go to the emergency department.  Pager Numbers  - Dr. Gwen Pounds: [phone]  - Dr. Roseanne Reno: [phone]  - Dr. Katrinka Blazing: [phone]   In the event of inclement weather, please call our main line at [phone] for an update on the status of any delays or closures.  Dermatology Medication Tips: Please keep the boxes that topical medications come in in order to help keep track of the instructions about where and how to use these. Pharmacies typically print the medication instructions only on the boxes and not directly on the medication tubes.   If your medication is too expensive, please contact our office at [phone] option 4 or send [place] a message through MyChart.   We are unable to tell what your co-pay for medications will be in advance as this is different depending on your insurance coverage. However, we may be able to find a substitute medication at lower cost or fill out paperwork to get insurance  to cover a needed medication.   If a prior authorization is required to get your medication covered by your insurance company, please allow [place] 1-2 business days to complete this process.  Drug prices often vary depending on where the prescription is filled and some pharmacies may offer cheaper prices.  The website www.goodrx.com contains coupons for medications through different pharmacies. The prices here do not account for what the cost may be with help from insurance (it may be cheaper with your insurance), but the website  can give you the price if you did not use any insurance.  - You can print the associated coupon and take it with your prescription to the pharmacy.  - You may also stop by our office during regular business hours and pick up a GoodRx coupon card.  - If you need your prescription sent electronically to a different pharmacy, notify our office through [facility] or by phone at [phone] option 4.     Si Usted Necesita Algo Despus de Su Visita  Tambin puede enviarnos un mensaje a travs de Clinical cytogeneticist. Por lo general respondemos a los mensajes de MyChart en el transcurso de 1 a 2 das hbiles.  Para renovar recetas, por favor pida a su farmacia que se ponga en contacto con nuestra oficina. Annie Sable de fax es [place] [phone].  Si tiene un asunto urgente cuando la clnica est cerrada y que no puede esperar hasta el siguiente da hbil, puede llamar/localizar a su doctor(a) al nmero que aparece a continuacin.   Por favor, tenga en cuenta que aunque hacemos todo lo posible para estar disponibles para asuntos urgentes fuera del horario de [place], no estamos disponibles las 24 horas del da, los 7 [address]  [address] de la [place].   Si tiene un problema urgente y no puede comunicarse con nosotros, puede optar por buscar atencin mdica  en el consultorio de su doctor(a), en una clnica privada, en un centro de atencin urgente o en una sala de emergencias.  Si tiene Engineer, drilling, por favor llame inmediatamente al 911 o vaya a la sala de emergencias.  Nmeros de bper  - Dr. Gwen Pounds: [phone]  - Dra. Roseanne Reno: [phone]  - Dr. Katrinka Blazing: [phone]   En caso de inclemencias del tiempo, por favor llame a Lacy Duverney principal al [phone] para una actualizacin sobre el [place] de cualquier retraso o cierre.  Consejos para la medicacin en dermatologa: Por favor, guarde las cajas en las que vienen los medicamentos de uso tpico para ayudarle a seguir las instrucciones  sobre dnde y cmo usarlos. Las farmacias generalmente imprimen las instrucciones del medicamento slo en las cajas y no directamente en los tubos del [place].   Si su medicamento es muy caro, por favor, pngase en contacto con Rolm Gala llamando al [phone] y presione la opcin 4 o envenos un mensaje a travs de Clinical cytogeneticist.   No podemos decirle cul ser su copago por los medicamentos por adelantado ya que esto es diferente dependiendo de la cobertura de su seguro. Sin embargo, es posible que podamos encontrar un medicamento sustituto a Audiological scientist un formulario para que el seguro cubra el medicamento que se considera necesario.   Si se requiere una autorizacin previa para que su compaa de seguros [place] su medicamento, por favor permtanos de 1 a 2 das hbiles para completar [address]  Los precios de los medicamentos varan con frecuencia dependiendo del Environmental consultant de dnde se surte la receta y Careers adviser pueden ofrecer  precios ms baratos.  El sitio web www.goodrx.com tiene cupones para medicamentos de Health and safety inspector. Los precios aqu no tienen en cuenta lo que podra costar con la ayuda del seguro (puede ser ms barato con su seguro), pero el sitio web puede darle el precio si no utiliz Tourist information centre manager.  - Puede imprimir el cupn correspondiente y llevarlo con su receta a la farmacia.  - Tambin puede pasar por nuestra oficina durante el horario de atencin regular y Education officer, museum una tarjeta de cupones de GoodRx.  - Si necesita que su receta se enve electrnicamente a una farmacia diferente, informe a nuestra oficina a travs de MyChart de [place] o por telfono llamando al [phone] y presione la opcin 4.

## 2023-01-18 ENCOUNTER — Encounter: Payer: Self-pay | Admitting: Dermatology

## 2023-01-21 ENCOUNTER — Ambulatory Visit: Payer: BC Managed Care – PPO | Admitting: Dermatology

## 2023-03-24 ENCOUNTER — Telehealth: Payer: Self-pay | Admitting: Cardiovascular Disease

## 2023-03-24 NOTE — Telephone Encounter (Signed)
Received phone call from primary care, Dr. Arlana Pouch Alex Burch was in [place] had a vagal event went to local hospital, checked out fine but pulse was in the 40s for at least 1 hour after event  He was instructed to reduce his metoprolol succinate in half daily  Signed, Dossie Arbour, MD, Ph.D [facility]

## 2023-04-01 ENCOUNTER — Other Ambulatory Visit: Payer: Self-pay | Admitting: Cardiovascular Disease

## 2023-06-02 ENCOUNTER — Other Ambulatory Visit: Payer: Self-pay | Admitting: Cardiovascular Disease

## 2023-06-02 DIAGNOSIS — I483 Typical atrial flutter: Secondary | ICD-10-CM

## 2023-06-02 NOTE — Telephone Encounter (Signed)
 Eliquis 5mg  refill request received. Patient is 68 years old, weight-74.8kg, Crea-1.02 on 09/09/22, Diagnosis-Aflutter, and last seen by Dr. Mariah Milling on 09/30/22. Dose is appropriate based on dosing criteria. Will send in refill to requested pharmacy.

## 2023-06-02 NOTE — Telephone Encounter (Signed)
 Please review

## 2023-09-17 ENCOUNTER — Other Ambulatory Visit: Payer: Self-pay | Admitting: Cardiovascular Disease

## 2023-11-10 ENCOUNTER — Telehealth: Payer: Self-pay | Admitting: Cardiovascular Disease

## 2023-11-10 NOTE — Telephone Encounter (Signed)
-----   Message from Belva Boyden sent at 11/10/2023 11:30 AM EDT ----- Regarding: f/u Received message from primary care that patient needs routine follow-up Thx TGollan

## 2023-11-10 NOTE — Telephone Encounter (Signed)
LVM to schedule fu appt, please schedule 

## 2023-12-03 ENCOUNTER — Other Ambulatory Visit: Payer: Self-pay | Admitting: Cardiovascular Disease

## 2023-12-03 DIAGNOSIS — I483 Typical atrial flutter: Secondary | ICD-10-CM

## 2023-12-04 NOTE — Telephone Encounter (Signed)
 Prescription refill request for Eliquis  received. Indication: Aflutter Last office visit: 09/30/22 [place])  Scr: 1 (03/24/23)  Age: 68 Weight: 74.8kg  Office visit overdue. Pt has scheduled appt with Dr Gollan on 01/05/24. Refill sent.

## 2023-12-31 ENCOUNTER — Encounter: Payer: Self-pay | Admitting: *Deleted

## 2024-01-04 NOTE — Progress Notes (Signed)
 Office Note  Date:  01/05/2024   ID:  Alex Burch, DOB April 13, 1956, MRN [id number]  PCP:  Corlis Honor BROCKS, MD   Chief Complaint  Patient presents with   6 month follow up     Doing well.     HPI:  Mr. Alex Burch is a 68 year old gentleman with past medical history of COVID January into February 2024  Evaluated February 2024 with new onset atrial flutter possibly from colonoscopy prep Who presents for follow-up of his atrial flutter and cardioversion September 11, 2022  Last seen by myself in clinic 5/24 Active at baseline Walks with no sx He does report having some knee pain, would like to take meloxicam  as needed  Denies any tachycardia concerning for atrial flutter Previously was asymptomatic when he was in flutter Denies any elevated heart rates using his watch concerning for arrhythmia   EKG personally reviewed by myself on todays visit EKG Interpretation Date/Time:  Tuesday January 05 2024 13:58:43 EDT Ventricular Rate:  50 PR Interval:  232 QRS Duration:  104 QT Interval:  470 QTC Calculation: 428 R Axis:   26  Text Interpretation: Sinus bradycardia with 1st degree A-V block Incomplete right bundle branch block When compared with ECG of 11-Sep-2022 08:00, No significant change was found Confirmed by Perla Lye [phone]) on 01/05/2024 2:02:02 PM   found to be in atrial flutter at the time of his colonoscopy February 2024 Started on eliquis , metoprolol   Echo September 02, 2022 Shows mildly depressed left ventricular function 40 to 45%, should be 55% or higher Some thickening of the wall also noted/hypertrophy By ventricle also appears mildly reduced, normal size Aortic valve with moderate regurgitation Mild dilation of aortic root  cardioversion September 11, 2022 Normal sinus rhythm restored   PMH:   has a past medical history of Actinic keratosis, Anxiety, Degenerative joint disease, Dysrhythmia, Hypercholesterolemia, Plantar fasciitis, Right inguinal  hernia, and Tendinitis of right hip.  PSH:    Past Surgical History:  Procedure Laterality Date   CARDIOVERSION N/A 09/11/2022   Procedure: CARDIOVERSION;  Surgeon: Perla Lye PARAS, MD;  Location: [facility] ORS;  Service: Cardiovascular;  Laterality: N/A;   COLONOSCOPY     INGUINAL HERNIA REPAIR Right 05/10/2020   Procedure: HERNIA REPAIR INGUINAL ADULT;  Surgeon: Kassie Ozell SAUNDERS, MD;  Location: [facility] ORS;  Service: Urology;  Laterality: Right;   NASAL SEPTUM SURGERY     TONSILLECTOMY      Current Outpatient Medications  Medication Sig Dispense Refill   ELIQUIS  5 MG TABS tablet TAKE 1 TABLET BY MOUTH 2 TIMES DAILY 60 tablet 5   rosuvastatin (CRESTOR) 10 MG tablet Take 10 mg by mouth daily.      meloxicam  (MOBIC ) 15 MG tablet Take 1 tablet (15 mg total) by mouth daily. 30 tablet 1   metoprolol  succinate (TOPROL -XL) 25 MG 24 hr tablet Take 0.5 tablets (12.5 mg total) by mouth daily. 45 tablet 3   No current facility-administered medications for this visit.   Allergies:   Penicillins and Penicillin g   Social History:  The patient  reports that he has never smoked. He has never used smokeless tobacco. He reports current alcohol use. He reports that he does not use drugs.   Family History:   family history includes Arrhythmia in his brother; Atrial fibrillation in his brother; Heart disease in his father; Heart failure in his father; Hyperlipidemia in his father; Hypertension in his father.    Review of Systems: Review of Systems  Constitutional: Negative.   HENT: Negative.    Respiratory: Negative.    Cardiovascular: Negative.   Gastrointestinal: Negative.   Musculoskeletal: Negative.   Neurological: Negative.   Psychiatric/Behavioral: Negative.    All other systems reviewed and are negative.   PHYSICAL EXAM: VS:  BP 120/68 (BP Location: Left Arm, Patient Position: Sitting, Cuff Size: Normal)   Pulse (!) 50   Ht 6' (1.829 m)   Wt 196 lb 6 oz (89.1 kg)   SpO2 98%   BMI 26.63  kg/m  , BMI Body mass index is 26.63 kg/m. Constitutional:  oriented to person, place, and time. No distress.  HENT:  Head: Grossly normal Eyes:  no discharge. No scleral icterus.  Neck: No JVD, no carotid bruits  Cardiovascular: Regular rate and rhythm, no murmurs appreciated Pulmonary/Chest: Clear to auscultation bilaterally, no wheezes or rails Abdominal: Soft.  no distension.  no tenderness.  Musculoskeletal: Normal range of motion Neurological:  normal muscle tone. Coordination normal. No atrophy Skin: Skin warm and dry Psychiatric: normal affect, pleasant  Recent Labs: No results found for requested labs within last 365 days.    Lipid Panel No results found for: CHOL, HDL, LDLCALC, TRIG    Wt Readings from Last 3 Encounters:  01/05/24 196 lb 6 oz (89.1 kg)  09/30/22 165 lb (74.8 kg)  09/11/22 170 lb (77.1 kg)     ASSESSMENT AND PLAN:  Problem List Items Addressed This Visit       Cardiology Problems   Typical atrial flutter (HCC) - Primary   Relevant Medications   metoprolol  succinate (TOPROL -XL) 25 MG 24 hr tablet   Other Relevant Orders   EKG 12-Lead (Completed)     Other   SOB (shortness of breath)   Relevant Orders   EKG 12-Lead (Completed)   Other Visit Diagnoses       Syncope, unspecified syncope type         Mixed hyperlipidemia       Relevant Medications   metoprolol  succinate (TOPROL -XL) 25 MG 24 hr tablet      Atrial flutter  February 2024 noted to have atrial fibrillation at the time of his colonoscopy prep Cardioversion April 2024, normal sinus rhythm restored Mildly depressed EF on echo in the setting of atrial flutter -Maintaining normal sinus rhythm -Recommend he continue metoprolol  succinate 12.5 daily, Continue Eliquis   Anxiety  runs a local shop Managed by primary care  Hyperlipidemia On Crestor 10 mg daily Exercises on a regular basis   Signed, Velinda Lunger, M.D., Ph.D. [facility] [place],  [place] [phone]

## 2024-01-05 ENCOUNTER — Encounter: Payer: Self-pay | Admitting: Cardiovascular Disease

## 2024-01-05 ENCOUNTER — Ambulatory Visit: Attending: Cardiovascular Disease | Admitting: Cardiovascular Disease

## 2024-01-05 VITALS — BP 120/68 | HR 50 | Ht 72.0 in | Wt 196.4 lb

## 2024-01-05 DIAGNOSIS — R0602 Shortness of breath: Secondary | ICD-10-CM | POA: Diagnosis not present

## 2024-01-05 DIAGNOSIS — I483 Typical atrial flutter: Secondary | ICD-10-CM | POA: Insufficient documentation

## 2024-01-05 DIAGNOSIS — E782 Mixed hyperlipidemia: Secondary | ICD-10-CM | POA: Insufficient documentation

## 2024-01-05 DIAGNOSIS — R55 Syncope and collapse: Secondary | ICD-10-CM | POA: Diagnosis not present

## 2024-01-05 MED ORDER — MELOXICAM 15 MG PO TABS: 15.0000 mg | ORAL_TABLET | Freq: Every day | ORAL | 1 refills | Status: DC

## 2024-01-05 MED ORDER — METOPROLOL SUCCINATE ER 25 MG PO TB24: 12.5000 mg | ORAL_TABLET | Freq: Every day | ORAL | 3 refills | Status: AC

## 2024-01-05 NOTE — Patient Instructions (Signed)

## 2024-01-06 ENCOUNTER — Telehealth: Payer: Self-pay | Admitting: Cardiovascular Disease

## 2024-01-06 NOTE — Telephone Encounter (Signed)
 Called and spoke with patient. Patient wanted to know if Metoprolol  could cause weight gain. Patient states that he has been on Metoprolol  over a year. Patient reports that he has gained 5 - 10 pounds in the last month. Patient would like to know if it could be from Metoprolol  and if so could he be given an alternative medication.

## 2024-01-06 NOTE — Telephone Encounter (Signed)
 Patient has weight gain concerns regarding Metoprolol  asking for other options, please advise,

## 2024-01-07 ENCOUNTER — Ambulatory Visit: Admitting: Dermatology

## 2024-01-07 ENCOUNTER — Encounter: Payer: Self-pay | Admitting: Dermatology

## 2024-01-07 DIAGNOSIS — D692 Other nonthrombocytopenic purpura: Secondary | ICD-10-CM

## 2024-01-07 DIAGNOSIS — Z79899 Other long term (current) drug therapy: Secondary | ICD-10-CM

## 2024-01-07 DIAGNOSIS — L820 Inflamed seborrheic keratosis: Secondary | ICD-10-CM | POA: Diagnosis not present

## 2024-01-07 DIAGNOSIS — Z1283 Encounter for screening for malignant neoplasm of skin: Secondary | ICD-10-CM

## 2024-01-07 DIAGNOSIS — L570 Actinic keratosis: Secondary | ICD-10-CM

## 2024-01-07 DIAGNOSIS — L578 Other skin changes due to chronic exposure to nonionizing radiation: Secondary | ICD-10-CM | POA: Diagnosis not present

## 2024-01-07 DIAGNOSIS — D1801 Hemangioma of skin and subcutaneous tissue: Secondary | ICD-10-CM

## 2024-01-07 DIAGNOSIS — W908XXA Exposure to other nonionizing radiation, initial encounter: Secondary | ICD-10-CM

## 2024-01-07 DIAGNOSIS — L719 Rosacea, unspecified: Secondary | ICD-10-CM

## 2024-01-07 DIAGNOSIS — I781 Nevus, non-neoplastic: Secondary | ICD-10-CM

## 2024-01-07 DIAGNOSIS — L709 Acne, unspecified: Secondary | ICD-10-CM

## 2024-01-07 DIAGNOSIS — L814 Other melanin hyperpigmentation: Secondary | ICD-10-CM

## 2024-01-07 DIAGNOSIS — L739 Follicular disorder, unspecified: Secondary | ICD-10-CM | POA: Diagnosis not present

## 2024-01-07 DIAGNOSIS — Z7189 Other specified counseling: Secondary | ICD-10-CM

## 2024-01-07 MED ORDER — AMZEEQ 4 % EX FOAM: CUTANEOUS | 6 refills | Status: DC

## 2024-01-07 NOTE — Patient Instructions (Addendum)
 Amzeeq  4 % foam apply twice daily for 7 days to pimple flares for folliculitis    Seborrheic Keratosis  What causes seborrheic keratoses? Seborrheic keratoses are harmless, common skin growths that first appear during adult life.  As time goes by, more growths appear.  Some people may develop a large number of them.  Seborrheic keratoses appear on both covered and uncovered body parts.  They are not caused by sunlight.  The tendency to develop seborrheic keratoses can be inherited.  They vary in color from skin-colored to gray, brown, or even black.  They can be either smooth or have a rough, warty surface.   Seborrheic keratoses are superficial and look as if they were stuck on the skin.  Under the microscope this type of keratosis looks like layers upon layers of skin.  That is why at times the top layer may seem to fall off, but the rest of the growth remains and re-grows.    Treatment Seborrheic keratoses do not need to be treated, but can easily be removed in the office.  Seborrheic keratoses often cause symptoms when they rub on clothing or jewelry.  Lesions can be in the way of shaving.  If they become inflamed, they can cause itching, soreness, or burning.  Removal of a seborrheic keratosis can be accomplished by freezing, burning, or surgery. If any spot bleeds, scabs, or grows rapidly, please return to have it checked, as these can be an indication of a skin cancer.   Actinic keratoses are precancerous spots that appear secondary to cumulative UV radiation exposure/sun exposure over time. They are chronic with expected duration over 1 year. A portion of actinic keratoses will progress to squamous cell carcinoma of the skin. It is not possible to reliably predict which spots will progress to skin cancer and so treatment is recommended to prevent development of skin cancer.  Recommend daily broad spectrum sunscreen SPF 30+ to sun-exposed areas, reapply every 2 hours as needed.  Recommend  staying in the shade or wearing long sleeves, sun glasses (UVA+UVB protection) and wide brim hats (4-inch brim around the entire circumference of the hat). Call for new or changing lesions.    Cryotherapy Aftercare  Wash gently with soap and water everyday.   Apply Vaseline and Band-Aid daily until healed.     Melanoma ABCDEs  Melanoma is the most dangerous type of skin cancer, and is the leading cause of death from skin disease.  You are more likely to develop melanoma if you: Have light-colored skin, light-colored eyes, or red or blond hair Spend a lot of time in the sun Tan regularly, either outdoors or in a tanning bed Have had blistering sunburns, especially during childhood Have a close family member who has had a melanoma Have atypical moles or large birthmarks  Early detection of melanoma is key since treatment is typically straightforward and cure rates are extremely high if we catch it early.   The first sign of melanoma is often a change in a mole or a new dark spot.  The ABCDE system is a way of remembering the signs of melanoma.  A for asymmetry:  The two halves do not match. B for border:  The edges of the growth are irregular. C for color:  A mixture of colors are present instead of an even brown color. D for diameter:  Melanomas are usually (but not always) greater than 6mm - the size of a pencil eraser. E for evolution:  The spot keeps  changing in size, shape, and color.  Please check your skin once per month between visits. You can use a small mirror in front and a large mirror behind you to keep an eye on the back side or your body.   If you see any new or changing lesions before your next follow-up, please call to schedule a visit.  Please continue daily skin protection including broad spectrum sunscreen SPF 30+ to sun-exposed areas, reapplying every 2 hours as needed when you're outdoors.   Staying in the shade or wearing long sleeves, sun glasses (UVA+UVB  protection) and wide brim hats (4-inch brim around the entire circumference of the hat) are also recommended for sun protection.    Due to recent changes in healthcare laws, you may see results of your pathology and/or laboratory studies on MyChart before the doctors have had a chance to review them. We understand that in some cases there may be results that are confusing or concerning to you. Please understand that not all results are received at the same time and often the doctors may need to interpret multiple results in order to provide you with the best plan of care or course of treatment. Therefore, we ask that you please give us  2 business days to thoroughly review all your results before contacting the office for clarification. Should we see a critical lab result, you will be contacted sooner.   If You Need Anything After Your Visit  If you have any questions or concerns for your doctor, please call our main line at [phone] and press option 4 to reach your doctor's medical assistant. If no one answers, please leave a voicemail as directed and we will return your call as soon as possible. Messages left after 4 pm will be answered the following business day.   You may also send us  a message via MyChart. We typically respond to MyChart messages within 1-2 business days.  For prescription refills, please ask your pharmacy to contact our office. Our fax number is [phone].  If you have an urgent issue when the clinic is closed that cannot wait until the next business day, you can page your doctor at the number below.    Please note that while we do our best to be available for urgent issues outside of office hours, we are not available 24/7.   If you have an urgent issue and are unable to reach us , you may choose to seek medical care at your doctor's office, retail clinic, urgent care center, or emergency room.  If you have a medical emergency, please immediately call 911 or go to the  emergency department.  Pager Numbers  - Dr. Hester: [phone]  - Dr. Jackquline: [phone]  - Dr. Claudene: [phone]   In the event of inclement weather, please call our main line at [phone] for an update on the status of any delays or closures.  Dermatology Medication Tips: Please keep the boxes that topical medications come in in order to help keep track of the instructions about where and how to use these. Pharmacies typically print the medication instructions only on the boxes and not directly on the medication tubes.   If your medication is too expensive, please contact our office at [phone] option 4 or send us  a message through MyChart.   We are unable to tell what your co-pay for medications will be in advance as this is different depending on your insurance coverage. However, we may be able to find a substitute  medication at lower cost or fill out paperwork to get insurance to cover a needed medication.   If a prior authorization is required to get your medication covered by your insurance company, please allow us  1-2 business days to complete this process.  Drug prices often vary depending on where the prescription is filled and some pharmacies may offer cheaper prices.  The website www.goodrx.com contains coupons for medications through different pharmacies. The prices here do not account for what the cost may be with help from insurance (it may be cheaper with your insurance), but the website can give you the price if you did not use any insurance.  - You can print the associated coupon and take it with your prescription to the pharmacy.  - You may also stop by our office during regular business hours and pick up a GoodRx coupon card.  - If you need your prescription sent electronically to a different pharmacy, notify our office through [facility] or by phone at [phone] option 4.     Si Usted Necesita Algo Despus de Su Visita  Tambin puede  enviarnos un mensaje a travs de Clinical cytogeneticist. Por lo general respondemos a los mensajes de MyChart en el transcurso de 1 a 2 das hbiles.  Para renovar recetas, por favor pida a su farmacia que se ponga en contacto con nuestra oficina. Randi lakes de fax es [place] [phone].  Si tiene un asunto urgente cuando la clnica est cerrada y que no puede esperar hasta el siguiente da hbil, puede llamar/localizar a su doctor(a) al nmero que aparece a continuacin.   Por favor, tenga en cuenta que aunque hacemos todo lo posible para estar disponibles para asuntos urgentes fuera del horario de [place], no estamos disponibles las 24 horas del da, los 7 [address]  [address] de la [place].   Si tiene un problema urgente y no puede comunicarse con nosotros, puede optar por buscar atencin mdica  en el consultorio de su doctor(a), en una clnica privada, en un centro de atencin urgente o en una sala de emergencias.  Si tiene Engineer, drilling, por favor llame inmediatamente al 911 o vaya a la sala de emergencias.  Nmeros de bper  - Dr. Hester: [phone]  - Dra. Jackquline: [phone]  - Dr. Claudene: [phone]   En caso de inclemencias del tiempo, por favor llame a landry capes principal al [phone] para una actualizacin sobre el [place] de cualquier retraso o cierre.  Consejos para la medicacin en dermatologa: Por favor, guarde las cajas en las que vienen los medicamentos de uso tpico para ayudarle a seguir las instrucciones sobre dnde y cmo usarlos. Las farmacias generalmente imprimen las instrucciones del medicamento slo en las cajas y no directamente en los tubos del [place].   Si su medicamento es muy caro, por favor, pngase en contacto con landry rieger llamando al [phone] y presione la opcin 4 o envenos un mensaje a travs de Clinical cytogeneticist.   No podemos decirle cul ser su copago por los medicamentos por adelantado ya que esto es diferente dependiendo de la cobertura de su  seguro. Sin embargo, es posible que podamos encontrar un medicamento sustituto a Audiological scientist un formulario para que el seguro cubra el medicamento que se considera necesario.   Si se requiere una autorizacin previa para que su compaa de seguros malta su medicamento, por favor permtanos de 1 a 2 das hbiles para completar este proceso.  Los precios de los medicamentos varan con frecuencia dependiendo del Environmental consultant  de dnde se surte la receta y alguna farmacias pueden ofrecer precios ms baratos.  El sitio web www.goodrx.com tiene cupones para medicamentos de Health and safety inspector. Los precios aqu no tienen en cuenta lo que podra costar con la ayuda del seguro (puede ser ms barato con su seguro), pero el sitio web puede darle el precio si no utiliz Tourist information centre manager.  - Puede imprimir el cupn correspondiente y llevarlo con su receta a la farmacia.  - Tambin puede pasar por nuestra oficina durante el horario de atencin regular y Education officer, museum una tarjeta de cupones de GoodRx.  - Si necesita que su receta se enve electrnicamente a una farmacia diferente, informe a nuestra oficina a travs de MyChart de [place] o por telfono llamando al [phone] y presione la opcin 4.

## 2024-01-07 NOTE — Progress Notes (Signed)
 Follow-Up Visit   Subjective  Alex Burch is a 68 y.o. male who presents for the following: Skin Cancer Screening and Full Body Skin family h/o of melanoma in sister, hx of aks, hx of rosacea   The patient presents for Total-Body Skin Exam (TBSE) for skin cancer screening and mole check. The patient has spots, moles and lesions to be evaluated, some may be new or changing and the patient may have concern these could be cancer.  The following portions of the chart were reviewed this encounter and updated as appropriate: medications, allergies, medical history  Review of Systems:  No other skin or systemic complaints except as noted in HPI or Assessment and Plan.  Objective  Well appearing patient in no apparent distress; mood and affect are within normal limits.  A full examination was performed including scalp, head, eyes, ears, nose, lips, neck, chest, axillae, abdomen, back, buttocks, bilateral upper extremities, bilateral lower extremities, hands, feet, fingers, toes, fingernails, and toenails. All findings within normal limits unless otherwise noted below.   Relevant physical exam findings are noted in the Assessment and Plan.  left forehead x 1 Erythematous thin papules/macules with gritty scale.  face x 16 (16) Erythematous stuck-on, waxy papule or plaque  Assessment & Plan   SKIN CANCER SCREENING PERFORMED TODAY.  FOLLICULITIS / Acne Exam: pimple bumps at right axillary and posterior neck Folliculitis occurs due to inflammation of the superficial hair follicle (pore), resulting in acne-like lesions (pus bumps). It can be infectious (bacterial, fungal) or noninfectious (shaving, tight clothing, heat/sweat, medications).  Folliculitis can be acute or chronic and recommended treatment depends on the underlying cause of folliculitis. Treatment Plan: Samples of Amzeeq  4 % foam given Start Amzeeq  4 % foam -  apply topically twice daily for 7 days to pimple flares for  folliculitis  6 rfs given  ACTINIC DAMAGE - Chronic condition, secondary to cumulative UV/sun exposure - diffuse scaly erythematous macules with underlying dyspigmentation - Recommend daily broad spectrum sunscreen SPF 30+ to sun-exposed areas, reapply every 2 hours as needed.  - Staying in the shade or wearing long sleeves, sun glasses (UVA+UVB protection) and wide brim hats (4-inch brim around the entire circumference of the hat) are also recommended for sun protection.  - Call for new or changing lesions.  LENTIGINES, SEBORRHEIC KERATOSES, HEMANGIOMAS - Benign normal skin lesions - Benign-appearing - Call for any changes  Brown spots lentigines, sks, and actinic damage  Bbl discussed in winter at face to treat sks and lentigines Counseling for BBL / IPL / Laser and Coordination of Care Discussed the treatment option of Broad Band Light (BBL) /Intense Pulsed Light (IPL)/ Laser for skin discoloration, including brown spots and redness.  Typically we recommend at least 1-3 treatment sessions about 5-8 weeks apart for best results.  Cannot have tanned skin when BBL performed, and regular use of sunscreen/photoprotection is advised after the procedure to help maintain results. The patient's condition may also require maintenance treatments in the future.  The fee for BBL / laser treatments is $350 per treatment session for the whole face.  A fee can be quoted for other parts of the body.  Insurance typically does not pay for BBL/laser treatments and therefore the fee is an out-of-pocket cost. Recommend prophylactic valtrex treatment. Once scheduled for procedure, will send Rx in prior to patient's appointment.    MELANOCYTIC NEVI - Tan-brown and/or pink-flesh-colored symmetric macules and papules - Benign appearing on exam today - Observation - Call  clinic for new or changing moles - Recommend daily use of broad spectrum spf 30+ sunscreen to sun-exposed areas.   Varicose Veins/Spider  Veins - Dilated blue, purple or red veins at the lower extremities - Reassured - Smaller vessels can be treated by sclerotherapy (a procedure to inject a medicine into the veins to make them disappear) if desired, but the treatment is not covered by insurance. Larger vessels may be covered if symptomatic and we would refer to vascular surgeon if treatment desired.  Purpura - Chronic; persistent and recurrent.  Treatable, but not curable. - Violaceous macules and patches - Benign - Related to trauma, age, sun damage and/or use of blood thinners, chronic use of topical and/or oral steroids - Observe - Can use OTC arnica containing moisturizer such as Dermend Bruise Formula if desired - Call for worsening or other concerns   ROSACEA Exam Clear at exam  Chronic condition with duration or expected duration over one year. Currently well-controlled. Rosacea is a chronic progressive skin condition usually affecting the face of adults, causing redness and/or acne bumps. It is treatable but not curable. It sometimes affects the eyes (ocular rosacea) as well. It may respond to topical and/or systemic medication and can flare with stress, sun exposure, alcohol, exercise, topical steroids (including hydrocortisone/cortisone 10) and some foods.  Daily application of broad spectrum spf 30+ sunscreen to face is recommended to reduce flares. Treatment Plan Discussed treatment but patient declined  In future could continue if patient interested   Skin Medicinals metronidazole/ivermectin/azelaic acid twice daily as needed to affected areas on the face. The patient was advised this is not covered by insurance since it is made by a compounding pharmacy. They will receive an email to check out and the medication will be mailed to their home.   Long term medication management.  Patient is using long term (months to years) prescription medication  to control their dermatologic condition.  These medications require  periodic monitoring to evaluate for efficacy and side effects and may require periodic laboratory monitoring.   Family History of Melanoma in Sister. - Recommend daily broad spectrum sunscreen SPF 30+ to sun-exposed areas, reapply every 2 hours as needed.  - Recommend staying in the shade or wearing long sleeves, sun glasses (UVA+UVB protection) and wide brim hats (4-inch brim around the entire circumference of the hat). - Call for new or changing lesions.  FOLLICULITIS   Related Medications Minocycline  HCl Micronized (AMZEEQ ) 4 % FOAM Apply topically to pimple areas under arms twice daily for folliculitis ACTINIC KERATOSIS left forehead x 1 Actinic keratoses are precancerous spots that appear secondary to cumulative UV radiation exposure/sun exposure over time. They are chronic with expected duration over 1 year. A portion of actinic keratoses will progress to squamous cell carcinoma of the skin. It is not possible to reliably predict which spots will progress to skin cancer and so treatment is recommended to prevent development of skin cancer.  Recommend daily broad spectrum sunscreen SPF 30+ to sun-exposed areas, reapply every 2 hours as needed.  Recommend staying in the shade or wearing long sleeves, sun glasses (UVA+UVB protection) and wide brim hats (4-inch brim around the entire circumference of the hat). Call for new or changing lesions. Destruction of lesion - left forehead x 1 Complexity: simple   Destruction method: cryotherapy   Informed consent: discussed and consent obtained   Timeout:  patient name, date of birth, surgical site, and procedure verified Lesion destroyed using liquid nitrogen: Yes   Region  frozen until ice ball extended beyond lesion: Yes   Outcome: patient tolerated procedure well with no complications   Post-procedure details: wound care instructions given    INFLAMED SEBORRHEIC KERATOSIS (16) face x 16 (16) Symptomatic, irritating, patient would like  treated. Destruction of lesion - face x 16 (16) Complexity: simple   Destruction method: cryotherapy   Informed consent: discussed and consent obtained   Timeout:  patient name, date of birth, surgical site, and procedure verified Lesion destroyed using liquid nitrogen: Yes   Region frozen until ice ball extended beyond lesion: Yes   Outcome: patient tolerated procedure well with no complications   Post-procedure details: wound care instructions given    Return in about 1 year (around 01/06/2025) for TBSE.  IEleanor Blush, CMA, am acting as scribe for Alm Rhyme, MD.   Documentation: I have reviewed the above documentation for accuracy and completeness, and I agree with the above.  Alm Rhyme, MD

## 2024-01-08 NOTE — Telephone Encounter (Signed)
 Called patient and notified him of the following from Dr. Gollan.  I suspect metoprolol  succinate 12.5 mg may not be contributing to weight gain I have other alternatives if he would like to try, they are other beta-blockers Thx TGollan  Patient verbalizes understanding. Patient states that he will stay on Metoprolol  at this time.

## 2024-06-16 ENCOUNTER — Other Ambulatory Visit: Payer: Self-pay | Admitting: Cardiovascular Disease

## 2024-06-16 NOTE — Telephone Encounter (Signed)
" °*  STAT* If patient is at the pharmacy, call can be transferred to refill team.   1. Which medications need to be refilled? (please list name of each medication and dose if known) rosuvastatin (CRESTOR) 10 MG tablet    2. Would you like to learn more about the convenience, safety, & potential cost savings by using the [facility]? No     3. Are you open to using the [facility] (Type [facility]. No    4. Which pharmacy/location (including street and city if local pharmacy) is medication to be sent to? [facility] - [place], [place] - [address]     5. Do they need a 30 day or 90 day supply? 90 day   Pt asking if Dr. Gollan will fill this Rx for him.  "

## 2024-06-30 ENCOUNTER — Other Ambulatory Visit: Payer: Self-pay | Admitting: Cardiovascular Disease

## 2024-06-30 ENCOUNTER — Ambulatory Visit: Admitting: Cardiology

## 2024-06-30 ENCOUNTER — Encounter: Payer: Self-pay | Admitting: Cardiology

## 2024-06-30 ENCOUNTER — Telehealth: Payer: Self-pay

## 2024-06-30 VITALS — BP 140/74 | HR 68 | Ht 72.0 in | Wt 193.6 lb

## 2024-06-30 DIAGNOSIS — D6869 Other thrombophilia: Secondary | ICD-10-CM

## 2024-06-30 DIAGNOSIS — I4892 Unspecified atrial flutter: Secondary | ICD-10-CM

## 2024-06-30 DIAGNOSIS — I483 Typical atrial flutter: Secondary | ICD-10-CM

## 2024-06-30 NOTE — Patient Instructions (Signed)
 Medication Instructions:  Your physician recommends that you continue on your current medications as directed. Please refer to the Current Medication list given to you today.  *If you need a refill on your cardiac medications before your next appointment, please call your pharmacy*  Lab Work: Your provider would like for you to have following labs drawn today CBC, BMP, Magnesium and TSH.     Testing/Procedures:     Dear Alex Burch  You are scheduled for a Cardioversion on Tuesday, February 10 with Dr. Timothy Gollan.  Please arrive at the [facility] Entrance of [facility], [address], [place] [address] at 6:30 AM (This is One hour(s) prior to your procedure time).  Proceed to the Check-In Desk directly inside the entrance.  Procedure Parking: Use the entrance off of the [address] side of the hospital. Turn right upon entering and follow the driveway to parking that is directly in front of the [facility]. There is no valet parking available at this entrance, however there is an awning directly in front of the [facility] for drop off/ pick up for patients.    DIET:  Nothing to eat or drink after midnight except a sip of water with medications (see medication instructions below)  MEDICATION INSTRUCTIONS: !!IF ANY NEW MEDICATIONS ARE STARTED AFTER TODAY, PLEASE NOTIFY YOUR PROVIDER AS SOON AS POSSIBLE!!   Continue taking your anticoagulant (blood thinner): Apixaban  (Eliquis ).  You will need to continue this after your procedure until you are told by your provider that it is safe to stop.    LABS: To be done on 06/30/24 with office visit.  FYI:  For your safety, and to allow us  to monitor your vital signs accurately during the surgery/procedure we request: If you have artificial nails, gel coating, SNS etc, please have those removed prior to your surgery/procedure. Not having the nail coverings /polish removed may result in cancellation  or delay of your surgery/procedure.  Your support person will be asked to wait in the waiting room during your procedure.  It is OK to have someone drop you off and come back when you are ready to be discharged.  You cannot drive after the procedure and will need someone to drive you home.  Bring your insurance cards.  *Special Note: Every effort is made to have your procedure done on time. Occasionally there are emergencies that occur at the hospital that may cause delays. Please be patient if a delay does occur.      Follow-Up: At [facility], you and your health needs are our priority.  As part of our continuing mission to provide you with exceptional heart care, our providers are all part of one team.  This team includes your primary Cardiologist (physician) and Advanced Practice Providers or APPs (Physician Assistants and Nurse Practitioners) who all work together to provide you with the care you need, when you need it.  Your next appointment:    To be determined   Provider:   Dr. Fonda Kitty

## 2024-06-30 NOTE — Progress Notes (Signed)
 "     Electrophysiology Clinic Note    Date:  06/30/2024  Patient ID:  Alex Burch 1956-02-01, MRN [id number] PCP:  Corlis Honor BROCKS, MD (Inactive)  Cardiologist:  Evalene Lunger, MD      Discussed the use of AI scribe software for clinical note transcription with the patient, who gave verbal consent to proceed.   Patient Profile    Chief Complaint: dizziness  History of Present Illness: Alex Burch is a 69 y.o. male with PMH notable for atrial flutter, bradycardia, HFmrEF, HLD; seen today for EP evaluation of AF, dizziness.  Aflutter was incidentally diagnosed after colonoscopy prep in February 2024. He is s/p DCCV 08/2022 and has been maintaining sinus rhythm since that time.  He last saw Dr. Gollan 12/2023 where he was doing well without cardiac complaints. Using watch to monitor for arrhythmia. He was continued on 12.5mg  toprol  daily and eliquis .   He noticed a about 4-5days ago he developed brief episodes of lightheadedness, mainly when getting up at night and while lying in bed, described as a mild rush of lightheadedness. Similar symptoms recurred on waking this morning and prompted evaluation.  He feels well during the day and exercises an hour daily without chest pain, pressure, palpitations, or exertional symptoms. His smartwatch showed atrial fibrillation this morning, with subsequent normal readings.  He takes Eliquis  twice daily, a nightly statin, and metoprolol , which he has been cutting in half because of prior fainting episodes.  Family history is notable for atrial fibrillation in his mother and brother.         Arrhythmia/Device History No specialty comments available.    ROS:  Please see the history of present illness. All other systems are reviewed and otherwise negative.    Physical Exam    VS:  BP (!) 140/74 (BP Location: Left Arm, Patient Position: Sitting, Cuff Size: Normal)   Pulse 68   Ht 6' (1.829 m)   Wt 193 lb 9.6 oz  (87.8 kg)   SpO2 97%   BMI 26.26 kg/m  BMI: Body mass index is 26.26 kg/m.  Orthostatic VS for the past 24 hrs (Last 3 readings):  BP- Lying Pulse- Lying BP- Sitting Pulse- Sitting BP- Standing at 0 minutes Pulse- Standing at 0 minutes BP- Standing at 3 minutes Pulse- Standing at 3 minutes  06/30/24 1041 139/81 66 143/81 68 130/77 68 130/78 70          Wt Readings from Last 3 Encounters:  06/30/24 193 lb 9.6 oz (87.8 kg)  01/05/24 196 lb 6 oz (89.1 kg)  09/30/22 165 lb (74.8 kg)     GEN- The patient is well appearing, alert and oriented x 3 today.   Lungs- Clear to ausculation bilaterally, normal work of breathing.  Heart- Regular rate and rhythm, no murmurs, rubs or gallops Extremities- No peripheral edema, warm, dry    Studies Reviewed   Previous EP, cardiology notes.    EKG is ordered. Personal review of EKG from today shows:    EKG Interpretation Date/Time:  Thursday June 30 2024 10:35:07 EST Ventricular Rate:  68 PR Interval:    QRS Duration:  102 QT Interval:  450 QTC Calculation: 478 R Axis:   43  Text Interpretation: Atrial flutter with 4:1 A-V conduction RSR' or QR pattern in V1 suggests right ventricular conduction delay Confirmed by Kassy Mcenroe [phone]) on 06/30/2024 10:44:38 AM    TTE, 09/02/2022  1. Left ventricular ejection fraction, by estimation, is  40 to 45%. Left ventricular ejection fraction by PLAX is 47 %. The left ventricle has mildly decreased function. The left ventricle demonstrates global hypokinesis. There is moderate left ventricular hypertrophy. Left ventricular diastolic parameters are indeterminate. The average left ventricular global longitudinal strain is -11.1 %.   2. Right ventricular systolic function is mildly reduced. The right ventricular size is normal. There is normal pulmonary artery systolic pressure. The estimated right ventricular systolic pressure is 31.4 mmHg.   3. Left atrial size was mildly dilated.   4. The mitral valve  is normal in structure. Mild mitral valve regurgitation. No evidence of mitral stenosis.   5. The aortic valve is tricuspid. Aortic valve regurgitation is moderate. Aortic valve sclerosis is present, with no evidence of aortic valve stenosis.   6. There is mild dilatation of the aortic root, measuring 42 mm.   7. The inferior vena cava is normal in size with greater than 50% respiratory variability, suggesting right atrial pressure of 3 mmHg.    Assessment and Plan     #) atrial flutter S/p DCCV 08/2022 without known recurrence of atrial arrhythmia. He diligently wears apple watch that did not alert for atrial flutter earlier today. He developed morning dizziness a few days ago and estimates that he was likely in atrial flutter at that time.  Ventricular rate well-controlled, Continue 12.5mg  toprol  daily Most recent TTE with mid-range LVEF, rhythm management is indicated We discussed repeat DCCV at next available opportunity. Also discussed Aflutter ablation, he appears appropriate ablation candidate. Will defer to MD for final decision Update TTE after DCCV to re-assess LVEF Update CBC, BMP, TSH today   #) Hypercoag d/t atrial flutter CHA2DS2-VASc Score = at least 2 [CHF History: 1, HTN History: 0, Diabetes History: 0, Stroke History: 0, Vascular Disease History: 0, Age Score: 1, Gender Score: 0].  Therefore, the patient's annual risk of stroke is 2.2 %.    Stroke ppx - 5mg  eliquis  BID, appropriately dosed No bleeding concerns   #) HFmrEF He appears warm and euvolemic with great exercise tolerance Continue Bb as above Rhythm mgmt as above    Informed Consent   Shared Decision Making/Informed Consent The risks (stroke, cardiac arrhythmias rarely resulting in the need for a temporary or permanent pacemaker, skin irritation or burns and complications associated with conscious sedation including aspiration, arrhythmia, respiratory failure and death), benefits (restoration of normal  sinus rhythm) and alternatives of a direct current cardioversion were explained in detail to Alex Burch and he agrees to proceed.        Current medicines are reviewed at length with the patient today.   The patient does not have concerns regarding his medicines.  The following changes were made today:  none  Labs/ tests ordered today include:  Orders Placed This Encounter  Procedures   CBC   Basic metabolic panel with GFR   Magnesium   TSH   EKG 12-Lead     Disposition: Follow up with Dr. Kennyth or EP APP as usual post procedure   Signed, Chantal Needle, NP  06/30/24  12:19 PM  Electrophysiology [facility] "

## 2024-06-30 NOTE — Telephone Encounter (Signed)
 I left a voicemail for Alex Burch indicating that Suzann Riddle, NP would like to add on an Echocardiogram in the days after his scheduled cardioversion (07/05/2024). I explained that there was no preparation for this and it would take place at the same office that he sees Cardiology. Our Scheduling Team will reach out to set up this appointment. Suzann Riddle, NP notified.

## 2024-07-01 ENCOUNTER — Ambulatory Visit: Payer: Self-pay | Admitting: Cardiology

## 2024-07-01 LAB — CBC
Hematocrit: 46 % (ref 37.5–51.0)
Hemoglobin: 14.8 g/dL (ref 13.0–17.7)
MCH: 29 pg (ref 26.6–33.0)
MCHC: 32.2 g/dL (ref 31.5–35.7)
MCV: 90 fL (ref 79–97)
Platelets: 256 10*3/uL (ref 150–450)
RBC: 5.11 x10E6/uL (ref 4.14–5.80)
RDW: 12.9 % (ref 11.6–15.4)
WBC: 6.9 10*3/uL (ref 3.4–10.8)

## 2024-07-01 LAB — BASIC METABOLIC PANEL WITH GFR
BUN/Creatinine Ratio: 20 (ref 10–24)
BUN: 19 mg/dL (ref 8–27)
CO2: 22 mmol/L (ref 20–29)
Calcium: 9.6 mg/dL (ref 8.6–10.2)
Chloride: 103 mmol/L (ref 96–106)
Creatinine, Ser: 0.96 mg/dL (ref 0.76–1.27)
Glucose: 108 mg/dL — ABNORMAL HIGH (ref 70–99)
Potassium: 4.3 mmol/L (ref 3.5–5.2)
Sodium: 142 mmol/L (ref 134–144)
eGFR: 87 mL/min/{1.73_m2}

## 2024-07-01 LAB — TSH: TSH: 3.29 u[IU]/mL (ref 0.450–4.500)

## 2024-07-01 LAB — MAGNESIUM: Magnesium: 1.9 mg/dL (ref 1.6–2.3)

## 2024-07-05 ENCOUNTER — Encounter: Admission: RE | Payer: Self-pay

## 2024-07-05 ENCOUNTER — Ambulatory Visit: Admission: RE | Admit: 2024-07-05 | Admitting: Cardiovascular Disease

## 2024-07-06 ENCOUNTER — Ambulatory Visit

## 2025-01-10 ENCOUNTER — Ambulatory Visit: Admitting: Dermatology

## 2025-05-31 ENCOUNTER — Ambulatory Visit: Admitting: Dermatology
